# Patient Record
Sex: Male | Born: 1960 | Race: Black or African American | Hispanic: No | Marital: Single | Smoking: Never smoker
Health system: Southern US, Community
[De-identification: ages and names within clinical notes are randomized; demographics above are authoritative.]

## PROBLEM LIST (undated history)

## (undated) DIAGNOSIS — E119 Type 2 diabetes mellitus without complications: Secondary | ICD-10-CM

## (undated) DIAGNOSIS — I10 Essential (primary) hypertension: Secondary | ICD-10-CM

## (undated) HISTORY — PX: CORONARY ANGIOPLASTY WITH STENT PLACEMENT: SHX49

## (undated) HISTORY — PX: APPENDECTOMY: SHX54

---

## 2013-10-28 DIAGNOSIS — Z794 Long term (current) use of insulin: Secondary | ICD-10-CM | POA: Insufficient documentation

## 2013-10-28 DIAGNOSIS — I4892 Unspecified atrial flutter: Secondary | ICD-10-CM | POA: Insufficient documentation

## 2013-10-28 DIAGNOSIS — M2550 Pain in unspecified joint: Secondary | ICD-10-CM | POA: Insufficient documentation

## 2013-10-28 DIAGNOSIS — I4891 Unspecified atrial fibrillation: Secondary | ICD-10-CM | POA: Insufficient documentation

## 2013-12-29 DIAGNOSIS — M1180 Other specified crystal arthropathies, unspecified site: Secondary | ICD-10-CM | POA: Insufficient documentation

## 2014-10-07 DIAGNOSIS — E785 Hyperlipidemia, unspecified: Secondary | ICD-10-CM | POA: Insufficient documentation

## 2014-10-07 DIAGNOSIS — G63 Polyneuropathy in diseases classified elsewhere: Secondary | ICD-10-CM | POA: Insufficient documentation

## 2014-10-07 DIAGNOSIS — E1121 Type 2 diabetes mellitus with diabetic nephropathy: Secondary | ICD-10-CM | POA: Insufficient documentation

## 2016-01-19 ENCOUNTER — Emergency Department (HOSPITAL_COMMUNITY)
Admission: EM | Admit: 2016-01-19 | Discharge: 2016-01-19 | Disposition: A | Discharge status: Home / Self Care | Payer: Self-pay | Attending: Emergency Medicine | Admitting: Emergency Medicine

## 2016-01-19 ENCOUNTER — Encounter (HOSPITAL_COMMUNITY): Payer: Self-pay | Admitting: Emergency Medicine

## 2016-01-19 ENCOUNTER — Emergency Department (HOSPITAL_COMMUNITY): Payer: Self-pay

## 2016-01-19 DIAGNOSIS — R0789 Other chest pain: Secondary | ICD-10-CM | POA: Insufficient documentation

## 2016-01-19 DIAGNOSIS — Z9861 Coronary angioplasty status: Secondary | ICD-10-CM | POA: Insufficient documentation

## 2016-01-19 DIAGNOSIS — I10 Essential (primary) hypertension: Secondary | ICD-10-CM | POA: Insufficient documentation

## 2016-01-19 DIAGNOSIS — E119 Type 2 diabetes mellitus without complications: Secondary | ICD-10-CM | POA: Insufficient documentation

## 2016-01-19 HISTORY — DX: Type 2 diabetes mellitus without complications: E11.9

## 2016-01-19 HISTORY — DX: Essential (primary) hypertension: I10

## 2016-01-19 LAB — CBC
HEMATOCRIT: 34 % — AB (ref 39.0–52.0)
HEMOGLOBIN: 11.4 g/dL — AB (ref 13.0–17.0)
MCH: 31.1 pg (ref 26.0–34.0)
MCHC: 33.5 g/dL (ref 30.0–36.0)
MCV: 92.9 fL (ref 78.0–100.0)
Platelets: 263 10*3/uL (ref 150–400)
RBC: 3.66 MIL/uL — ABNORMAL LOW (ref 4.22–5.81)
RDW: 11.5 % (ref 11.5–15.5)
WBC: 6.8 10*3/uL (ref 4.0–10.5)

## 2016-01-19 LAB — PROTIME-INR
INR: 1.25 (ref 0.00–1.49)
PROTHROMBIN TIME: 15.9 s — AB (ref 11.6–15.2)

## 2016-01-19 LAB — COMPREHENSIVE METABOLIC PANEL
ALBUMIN: 3.8 g/dL (ref 3.5–5.0)
ALT: 17 U/L (ref 17–63)
AST: 23 U/L (ref 15–41)
Alkaline Phosphatase: 60 U/L (ref 38–126)
Anion gap: 9 (ref 5–15)
BUN: 19 mg/dL (ref 6–20)
CHLORIDE: 102 mmol/L (ref 101–111)
CO2: 23 mmol/L (ref 22–32)
CREATININE: 0.85 mg/dL (ref 0.61–1.24)
Calcium: 9.4 mg/dL (ref 8.9–10.3)
GFR calc non Af Amer: >60 mL/min (ref >60)
Glucose, Bld: 347 mg/dL — ABNORMAL HIGH (ref 65–99)
POTASSIUM: 4.5 mmol/L (ref 3.5–5.1)
Sodium: 134 mmol/L — ABNORMAL LOW (ref 135–145)
Total Bilirubin: 0.9 mg/dL (ref 0.3–1.2)
Total Protein: 6.3 g/dL — ABNORMAL LOW (ref 6.5–8.1)

## 2016-01-19 LAB — TROPONIN I

## 2016-01-19 LAB — MAGNESIUM: MAGNESIUM: 1.7 mg/dL (ref 1.7–2.4)

## 2016-01-19 LAB — I-STAT TROPONIN, ED: TROPONIN I, POC: 0 ng/mL (ref 0.00–0.08)

## 2016-01-19 MED ORDER — ASPIRIN 81 MG PO CHEW: 324.0000 mg | CHEWABLE_TABLET | Freq: Once | ORAL | Status: DC

## 2016-01-19 NOTE — ED Provider Notes (Signed)
CSN: [id number]     Arrival date & time 01/19/16  1057 History   First MD Initiated Contact with Patient 01/19/16 1059     Chief Complaint  Patient presents with  . Chest Pain     (Consider location/radiation/quality/duration/timing/severity/associated sxs/prior Treatment) HPI Vision presents with chest pain, resolved. Patient was well prior to the onset of pain about 2 hours ago. Pain was left-sided, sore, moderate, with radiation to left arm. Pain resolved with aspirin, nitroglycerin provided by EMS providers. Currently, no complaints. Patient does acknowledge recent GI like illness, though this seems mild, more with stomach discomfort, then with focal abdominal pain, no fever, no substantial diarrhea, nor vomiting. Multiple sick contacts recently. He also has a notable history stent placement 1 year ago.   Past Medical History  Diagnosis Date  . Hypertension   . Diabetes mellitus without complication [facility](HCC)    Past Surgical History  Procedure Laterality Date  . Appendectomy    . Coronary angioplasty with stent placement     History reviewed. No pertinent family history. Social History  Substance Use Topics  . Smoking status: Never Smoker   . Smokeless tobacco: None  . Alcohol Use: Yes    Review of Systems  Constitutional:       Per HPI, otherwise negative  HENT:       Per HPI, otherwise negative  Respiratory:       Per HPI, otherwise negative  Cardiovascular:       Per HPI, otherwise negative  Gastrointestinal: Negative for vomiting.  Endocrine:       Negative aside from HPI  Genitourinary:       Neg aside from HPI   Musculoskeletal:       Per HPI, otherwise negative  Skin: Negative.   Neurological: Negative for syncope.      Allergies  Review of patient's allergies indicates no known allergies.  Home Medications   Prior to Admission medications   Not on File   BP 132/79 mmHg  Pulse 64  Temp(Src) 98.1 F (36.7 C) (Oral)  Resp 17  SpO2  100% Physical Exam  Constitutional: He is oriented to person, place, and time. He appears well-developed. No distress.  HENT:  Head: Normocephalic and atraumatic.  Eyes: Conjunctivae and EOM are normal.  Cardiovascular: Normal rate and regular rhythm.   Pulmonary/Chest: Effort normal. No stridor. No respiratory distress.  Abdominal: He exhibits no distension. There is no tenderness.  Musculoskeletal: He exhibits no edema.  Neurological: He is alert and oriented to person, place, and time.  Skin: Skin is warm and dry.  Psychiatric: He has a normal mood and affect.  Nursing note and vitals reviewed.   ED Course  Procedures (including critical care time) Labs Review Labs Reviewed  CBC - Abnormal; Notable for the following:    RBC 3.66 (*)    Hemoglobin 11.4 (*)    HCT 34.0 (*)    All other components within normal limits  COMPREHENSIVE METABOLIC PANEL - Abnormal; Notable for the following:    Sodium 134 (*)    Glucose, Bld 347 (*)    Total Protein 6.3 (*)    All other components within normal limits  PROTIME-INR - Abnormal; Notable for the following:    Prothrombin Time 15.9 (*)    All other components within normal limits  MAGNESIUM  TROPONIN I    Imaging Review Dg Chest 2 View  01/19/2016  CLINICAL DATA:  Left-sided chest pain. EXAM: CHEST  2 VIEW COMPARISON:  None. FINDINGS: The heart size and mediastinal contours are within normal limits. Both lungs are clear. No pneumothorax or pleural effusion is noted. The visualized skeletal structures are unremarkable. IMPRESSION: No active cardiopulmonary disease. Electronically Signed   By: Lupita Raider, M.D.   On: 01/19/2016 12:11   I have personally reviewed and evaluated these images and lab results as part of my medical decision-making.   EKG Interpretation   Date/Time:  Thursday January 19 2016 11:01:10 EDT Ventricular Rate:  64 PR Interval:  124 QRS Duration: 78 QT Interval:  413 QTC Calculation: 426 R Axis:    47 Text Interpretation:  Sinus rhythm Sinus rhythm T wave abnormality  Artifact Abnormal ekg Confirmed by Gerhard Munch  MD ([id number]) on  01/19/2016 11:11:11 AM     On repeat exam the patient is in no distress No additional pain. Patient is awaiting troponin #2.   4:16 PM Patient in no distress.  He has no pain.  I discussed all findings with him.  He will f/u w PMD. MDM  Patient presents after episodic chest pain. Here, the patient awake and alert, in no distress, with no ongoing pain. Patient is taking aspirin for prevention. Here, the patient's evaluation is largely reassuring, with no recurrence of pain, no vital sign abnormalities, serial negative troponins, unremarkable EKG. With no evidence for ongoing ischemia, PE, other acute new findings, patient will follow up with his primary care physician.  Gerhard Munch, MD 01/19/16 1623

## 2016-01-19 NOTE — ED Notes (Signed)
Pt here from home with c/o left side chest pain that went down his left arm , pt had 324 asa and 1 nitro from ems , pt arrived with no c/o pain

## 2016-01-19 NOTE — Discharge Instructions (Signed)
As discussed, your evaluation today has been largely reassuring.  But, it is important that you monitor your condition carefully, and do not hesitate to return to the ED if you develop new, or concerning changes in your condition. ? ?Otherwise, please follow-up with your physician for appropriate ongoing care. ? ?

## 2016-04-26 DIAGNOSIS — M21611 Bunion of right foot: Secondary | ICD-10-CM | POA: Insufficient documentation

## 2017-04-08 DIAGNOSIS — G8929 Other chronic pain: Secondary | ICD-10-CM | POA: Insufficient documentation

## 2017-04-08 DIAGNOSIS — R45850 Homicidal ideations: Secondary | ICD-10-CM | POA: Insufficient documentation

## 2017-04-11 DIAGNOSIS — Z0000 Encounter for general adult medical examination without abnormal findings: Secondary | ICD-10-CM | POA: Insufficient documentation

## 2017-05-12 ENCOUNTER — Encounter (HOSPITAL_COMMUNITY): Payer: Self-pay | Admitting: Emergency Medicine

## 2017-05-12 ENCOUNTER — Emergency Department (HOSPITAL_COMMUNITY): Payer: Self-pay

## 2017-05-12 ENCOUNTER — Observation Stay (HOSPITAL_COMMUNITY)
Admission: EM | Admit: 2017-05-12 | Discharge: 2017-05-13 | Disposition: A | Discharge status: Home / Self Care | Payer: Self-pay | Attending: Family Medicine | Admitting: Family Medicine

## 2017-05-12 DIAGNOSIS — Z79899 Other long term (current) drug therapy: Secondary | ICD-10-CM | POA: Insufficient documentation

## 2017-05-12 DIAGNOSIS — D649 Anemia, unspecified: Secondary | ICD-10-CM | POA: Insufficient documentation

## 2017-05-12 DIAGNOSIS — R0789 Other chest pain: Secondary | ICD-10-CM | POA: Diagnosis present

## 2017-05-12 DIAGNOSIS — I2510 Atherosclerotic heart disease of native coronary artery without angina pectoris: Secondary | ICD-10-CM | POA: Insufficient documentation

## 2017-05-12 DIAGNOSIS — Z7982 Long term (current) use of aspirin: Secondary | ICD-10-CM | POA: Insufficient documentation

## 2017-05-12 DIAGNOSIS — R079 Chest pain, unspecified: Principal | ICD-10-CM | POA: Diagnosis present

## 2017-05-12 DIAGNOSIS — E119 Type 2 diabetes mellitus without complications: Secondary | ICD-10-CM | POA: Insufficient documentation

## 2017-05-12 DIAGNOSIS — Z8679 Personal history of other diseases of the circulatory system: Secondary | ICD-10-CM

## 2017-05-12 DIAGNOSIS — Z955 Presence of coronary angioplasty implant and graft: Secondary | ICD-10-CM | POA: Insufficient documentation

## 2017-05-12 DIAGNOSIS — Z794 Long term (current) use of insulin: Secondary | ICD-10-CM | POA: Insufficient documentation

## 2017-05-12 DIAGNOSIS — Z9049 Acquired absence of other specified parts of digestive tract: Secondary | ICD-10-CM | POA: Insufficient documentation

## 2017-05-12 DIAGNOSIS — I10 Essential (primary) hypertension: Secondary | ICD-10-CM | POA: Diagnosis present

## 2017-05-12 DIAGNOSIS — IMO0001 Reserved for inherently not codable concepts without codable children: Secondary | ICD-10-CM

## 2017-05-12 LAB — CBC
HEMATOCRIT: 27.2 % — AB (ref 39.0–52.0)
Hemoglobin: 9.6 g/dL — ABNORMAL LOW (ref 13.0–17.0)
MCH: 32.8 pg (ref 26.0–34.0)
MCHC: 35.3 g/dL (ref 30.0–36.0)
MCV: 92.8 fL (ref 78.0–100.0)
PLATELETS: 261 10*3/uL (ref 150–400)
RBC: 2.93 MIL/uL — AB (ref 4.22–5.81)
RDW: 11.9 % (ref 11.5–15.5)
WBC: 8.2 10*3/uL (ref 4.0–10.5)

## 2017-05-12 LAB — BASIC METABOLIC PANEL
ANION GAP: 7 (ref 5–15)
BUN: 16 mg/dL (ref 6–20)
CALCIUM: 8.5 mg/dL — AB (ref 8.9–10.3)
CO2: 24 mmol/L (ref 22–32)
Chloride: 108 mmol/L (ref 101–111)
Creatinine, Ser: 0.85 mg/dL (ref 0.61–1.24)
GFR calc non Af Amer: >60 mL/min (ref >60)
GLUCOSE: 274 mg/dL — AB (ref 65–99)
Potassium: 3.7 mmol/L (ref 3.5–5.1)
Sodium: 139 mmol/L (ref 135–145)

## 2017-05-12 LAB — I-STAT TROPONIN, ED: Troponin i, poc: 0.01 ng/mL (ref 0.00–0.08)

## 2017-05-12 LAB — CBG MONITORING, ED: Glucose-Capillary: 268 mg/dL — ABNORMAL HIGH (ref 65–99)

## 2017-05-12 LAB — GLUCOSE, CAPILLARY: GLUCOSE-CAPILLARY: 198 mg/dL — AB (ref 65–99)

## 2017-05-12 LAB — TROPONIN I

## 2017-05-12 MED ORDER — INSULIN ASPART 100 UNIT/ML ~~LOC~~ SOLN: 3.0000 [IU] | Freq: Three times a day (TID) | SUBCUTANEOUS | Status: DC

## 2017-05-12 MED ORDER — ASPIRIN EC 81 MG PO TBEC
81.0000 mg | DELAYED_RELEASE_TABLET | Freq: Every day | ORAL | Status: DC
  Administered 2017-05-13: 81 mg via ORAL
  Filled 2017-05-12: qty 1

## 2017-05-12 MED ORDER — NITROGLYCERIN 0.4 MG SL SUBL: 0.4000 mg | SUBLINGUAL_TABLET | SUBLINGUAL | Status: DC | PRN

## 2017-05-12 MED ORDER — MORPHINE SULFATE (PF) 2 MG/ML IV SOLN
1.0000 mg | INTRAVENOUS | Status: DC | PRN
  Administered 2017-05-13: 2 mg via INTRAVENOUS
  Filled 2017-05-12 (×2): qty 1

## 2017-05-12 MED ORDER — NITROGLYCERIN 0.4 MG SL SUBL
0.4000 mg | SUBLINGUAL_TABLET | SUBLINGUAL | Status: DC | PRN
  Administered 2017-05-12 (×2): 0.4 mg via SUBLINGUAL
  Filled 2017-05-12: qty 1

## 2017-05-12 MED ORDER — ENOXAPARIN SODIUM 40 MG/0.4ML ~~LOC~~ SOLN
40.0000 mg | SUBCUTANEOUS | Status: DC
  Administered 2017-05-13: 40 mg via SUBCUTANEOUS
  Filled 2017-05-12: qty 0.4

## 2017-05-12 MED ORDER — ONDANSETRON HCL 4 MG/2ML IJ SOLN: 4.0000 mg | Freq: Four times a day (QID) | INTRAMUSCULAR | Status: DC | PRN

## 2017-05-12 MED ORDER — ACETAMINOPHEN 325 MG PO TABS
650.0000 mg | ORAL_TABLET | Freq: Once | ORAL | Status: AC
  Administered 2017-05-12: 650 mg via ORAL
  Filled 2017-05-12: qty 2

## 2017-05-12 MED ORDER — INSULIN ASPART 100 UNIT/ML ~~LOC~~ SOLN: 0.0000 [IU] | Freq: Every day | SUBCUTANEOUS | Status: DC

## 2017-05-12 MED ORDER — ACETAMINOPHEN 325 MG PO TABS
650.0000 mg | ORAL_TABLET | ORAL | Status: DC | PRN
  Administered 2017-05-12 – 2017-05-13 (×2): 650 mg via ORAL
  Filled 2017-05-12: qty 2

## 2017-05-12 MED ORDER — AMITRIPTYLINE HCL 50 MG PO TABS
50.0000 mg | ORAL_TABLET | Freq: Every evening | ORAL | Status: DC | PRN
  Administered 2017-05-12: 50 mg via ORAL
  Filled 2017-05-12 (×2): qty 1

## 2017-05-12 MED ORDER — ASPIRIN 81 MG PO CHEW
324.0000 mg | CHEWABLE_TABLET | Freq: Once | ORAL | Status: AC
  Administered 2017-05-12: 324 mg via ORAL
  Filled 2017-05-12: qty 4

## 2017-05-12 MED ORDER — INSULIN GLARGINE 100 UNIT/ML ~~LOC~~ SOLN
20.0000 [IU] | Freq: Every day | SUBCUTANEOUS | Status: DC
  Administered 2017-05-12: 20 [IU] via SUBCUTANEOUS
  Filled 2017-05-12 (×2): qty 0.2

## 2017-05-12 MED ORDER — INSULIN ASPART 100 UNIT/ML ~~LOC~~ SOLN: 0.0000 [IU] | Freq: Three times a day (TID) | SUBCUTANEOUS | Status: DC

## 2017-05-12 NOTE — ED Notes (Signed)
carelink called  

## 2017-05-12 NOTE — H&P (Signed)
History and Physical    Todd Wagner OFB:[id number] DOB: January 04, 1961 DOA: 05/12/2017  PCP: [facility]   Patient coming from: Home  Chief Complaint: Chest pain, elevated CBG's  HPI: Todd Wagner is a 56 y.o. male with medical history significant for insulin-dependent diabetes mellitus, hypertension, atrial fibrillation status post ablation, and coronary artery disease with stent placed a couple years ago, now presenting to the emergency department with 3 or 4 hours of chest pain. Patient reports that his glucose readings have been elevated at home for the past day and he has been feeling generally weak, but was otherwise well until approximately 4 hours prior to his arrival when he experienced a moderate, dull, pain in the left chest and shoulder. Symptoms waxed and waned some, but with no definite alleviating or exacerbating factor identified. He called EMS was brought into the ED for evaluation. He denies recent fevers or chills and denies dyspnea or cough. There has not been any nausea or diaphoresis. He reports occasional episodes of similar pain since his stent was placed couple years ago.  ED Course: Upon arrival to the ED, patient is found to be afebrile, saturating well on room air, and with vitals otherwise stable. EKG features a sinus rhythm. Chest x-ray is negative for acute cardiopulmonary disease. Chemistry panel reveals a glucose of 274. CBC is notable for a normocytic anemia with hemoglobin 9.6, down from 12.9 in February. Troponin is within normal limits. Patient was treated with 324 mg of aspirin and sublingual nitroglycerin in the ED. He reported some improvement in his pain with nitroglycerin. He remained hemodynamically stable in the ED and in no apparent respiratory distress. He will be observed on the stepdown unit at [facility] for ongoing evaluation and management of chest pain in a patient with known CAD.  Review of  Systems:  All other systems reviewed and apart from HPI, are negative.  Past Medical History:  Diagnosis Date  . Diabetes mellitus without complication (HCC)   . Hypertension     Past Surgical History:  Procedure Laterality Date  . APPENDECTOMY    . CORONARY ANGIOPLASTY WITH STENT PLACEMENT       reports that he has never smoked. He does not have any smokeless tobacco history on file. He reports that he drinks alcohol. He reports that he does not use drugs.  No Known Allergies  History reviewed. No pertinent family history.   Prior to Admission medications   Medication Sig Start Date End Date Taking? Authorizing Provider  aspirin 81 MG tablet Take 81 mg by mouth daily.    [provider]  insulin NPH-regular Human (NOVOLIN 70/30) (70-30) 100 UNIT/ML injection Inject 20-25 Units into the skin See admin instructions. Pt takes 25 units in am and 20 units at bedtime    [provider]    Physical Exam: Vitals:   05/12/17 1541 05/12/17 1636 05/12/17 1644  BP: 135/75 120/66 116/69  Pulse: 66 70 60  Resp: 16 15 14   Temp: 97.7 F (36.5 C)    TempSrc: Oral    SpO2: 100% 99% 100%  Weight:  74.8 kg (165 lb)   Height:  6' (1.829 m)       Constitutional: NAD, calm, appears uncomfortable Eyes: PERTLA, lids and conjunctivae normal ENMT: Mucous membranes are moist. Posterior pharynx clear of any exudate or lesions.   Neck: normal, supple, no masses, no thyromegaly Respiratory: clear to auscultation bilaterally, no wheezing, no crackles. Normal respiratory effort.  Cardiovascular: S1 & S2 heard, regular rate and rhythm. No extremity edema. No significant JVD. Abdomen: No distension, no tenderness, no masses palpated. Bowel sounds normal.  Musculoskeletal: no clubbing / cyanosis. No joint deformity upper and lower extremities.    Skin: no significant rashes, lesions, ulcers. Warm, dry, well-perfused. Neurologic: CN 2-12 grossly intact. Sensation intact, DTR  normal. Strength 5/5 in all 4 limbs.  Psychiatric: Alert and oriented x 3. Calm, cooperative.     Labs on Admission: I have personally reviewed following labs and imaging studies  CBC:  Recent Labs Lab 05/12/17 1625  WBC 8.2  HGB 9.6*  HCT 27.2*  MCV 92.8  PLT 261   Basic Metabolic Panel:  Recent Labs Lab 05/12/17 1625  NA 139  K 3.7  CL 108  CO2 24  GLUCOSE 274*  BUN 16  CREATININE 0.85  CALCIUM 8.5*   GFR: Estimated Creatinine Clearance: 105.1 mL/min (by C-G formula based on SCr of 0.85 mg/dL). Liver Function Tests: No results for input(s): AST, ALT, ALKPHOS, BILITOT, PROT, ALBUMIN in the last 168 hours. No results for input(s): LIPASE, AMYLASE in the last 168 hours. No results for input(s): AMMONIA in the last 168 hours. Coagulation Profile: No results for input(s): INR, PROTIME in the last 168 hours. Cardiac Enzymes: No results for input(s): CKTOTAL, CKMB, CKMBINDEX, TROPONINI in the last 168 hours. BNP (last 3 results) No results for input(s): PROBNP in the last 8760 hours. HbA1C: No results for input(s): HGBA1C in the last 72 hours. CBG:  Recent Labs Lab 05/12/17 1628  GLUCAP 268*   Lipid Profile: No results for input(s): CHOL, HDL, LDLCALC, TRIG, CHOLHDL, LDLDIRECT in the last 72 hours. Thyroid Function Tests: No results for input(s): TSH, T4TOTAL, FREET4, T3FREE, THYROIDAB in the last 72 hours. Anemia Panel: No results for input(s): VITAMINB12, FOLATE, FERRITIN, TIBC, IRON, RETICCTPCT in the last 72 hours. Urine analysis: No results found for: COLORURINE, APPEARANCEUR, LABSPEC, PHURINE, GLUCOSEU, HGBUR, BILIRUBINUR, KETONESUR, PROTEINUR, UROBILINOGEN, NITRITE, LEUKOCYTESUR Sepsis Labs: @LABRCNTIP (procalcitonin:4,lacticidven:4) )No results found for this or any previous visit (from the past 240 hour(s)).   Radiological Exams on Admission: Dg Chest Portable 1 View  Result Date: 05/12/2017 CLINICAL DATA:  Hyperglycemia.  Hypertension EXAM:  PORTABLE CHEST 1 VIEW COMPARISON:  January 19, 2016 FINDINGS: There is no edema or consolidation. Heart is upper normal in size with pulmonary vascularity within normal limits. No adenopathy. No bone lesions. IMPRESSION: No edema or consolidation. Electronically Signed   By: Bretta Bang III M.D.   On: 05/12/2017 16:19    EKG: Independently reviewed. Normal sinus rhythm.   Assessment/Plan   1. Chest pain - Pt presents with 3-4 hours of pain in left chest and shoulder  - He has known CAD with stent from [facility], but also known left rotator cuff tendonopathy  - EKG without acute ischemic features and troponin wnl  - Treated in ED with ASA 324 and SL NTG  - Plan to continue cardiac monitoring, obtain serial troponin measurements, repeat EKG    2. Insulin-dependent DM  - A1c was 8.1% in July 2018 - Managed at home with insulin NPH 25 units qAM and 20 units qPM  - Check CBG's with meals and qHS - Start Lantus 20 units qHS with Novolog 3 units TID with meals, and per moderate-intensity sliding-scale   3. Anemia  - Hgb is 9.6 on admission, down from 12.9 in February 2018  - Pt denies melena or hematochezia  - Check anemia panel   4. Hx  of atrial fibrillation  - Status-post ablation  - In NSR on admission  - CHADS-VASc is 3 (HTN, DM, CAD)  - No a fib since ablation per report    DVT prophylaxis: sq Lovenox Code Status: Full  Family Communication: Discussed with patient Disposition Plan: Observe in SDU at [facility] Consults called: None Admission status: Observation    Briscoe Deutscher, MD [facility] Pager [phone]  If 7PM-7AM, please contact night-coverage [email or web address] Password [facility]  05/12/2017, 6:19 PM

## 2017-05-12 NOTE — ED Provider Notes (Signed)
[facility]-EMERGENCY DEPT Provider Note   CSN: [id number] Arrival date & time: 05/12/17  1526     History   Chief Complaint Chief Complaint  Patient presents with  . Chest Pain    HPI Todd Wagner is a 56 y.o. male.  HPI  Patient presents to the emergency room with complaints of chest pain. According to the EMS notes the patient was complaining of hyperglycemia since last night. However, when I asked the patient what brought him to the emergency room he said it was carefully started having chest pain a few hours ago. He is having constant discomfort, pressure in the left side of his chest. He also has some taken his shoulder. He denies any nausea or shortness of breath. No vomiting or diarrhea. No fevers or chills. No coughing. No recent injuries. Patient does have history of coronary artery disease with stent. He has history of diabetes denies any tobacco use but he does smoke marijuana Past Medical History:  Diagnosis Date  . Diabetes mellitus without complication (HCC)   . Hypertension     There are no active problems to display for this patient.   Past Surgical History:  Procedure Laterality Date  . APPENDECTOMY    . CORONARY ANGIOPLASTY WITH STENT PLACEMENT         Home Medications    Prior to Admission medications   Medication Sig Start Date End Date Taking? Authorizing Provider  aspirin 81 MG tablet Take 81 mg by mouth daily.    [provider]  insulin NPH-regular Human (NOVOLIN 70/30) (70-30) 100 UNIT/ML injection Inject 20-25 Units into the skin See admin instructions. Pt takes 25 units in am and 20 units at bedtime    [provider]    Family History No family history on file.  Social History Social History  Substance Use Topics  . Smoking status: Never Smoker  . Smokeless tobacco: Not on file  . Alcohol use Yes     Allergies   Patient has no known allergies.   Review of Systems Review of Systems  All other systems reviewed and  are negative.    Physical Exam Updated Vital Signs BP 116/69 (BP Location: Right Arm)   Pulse 60   Temp 97.7 F (36.5 C) (Oral)   Resp 14   Ht 1.829 m (6')   Wt 74.8 kg (165 lb)   SpO2 100%   BMI 22.38 kg/m   Physical Exam  Constitutional: He appears well-developed and well-nourished. No distress.  HENT:  Head: Normocephalic and atraumatic.  Right Ear: External ear normal.  Left Ear: External ear normal.  Eyes: Conjunctivae are normal. Right eye exhibits no discharge. Left eye exhibits no discharge. No scleral icterus.  Neck: Neck supple. No tracheal deviation present.  Cardiovascular: Normal rate, regular rhythm and intact distal pulses.   Pulmonary/Chest: Effort normal and breath sounds normal. No stridor. No respiratory distress. He has no wheezes. He has no rales.  Abdominal: Soft. Bowel sounds are normal. He exhibits no distension. There is no tenderness. There is no rebound and no guarding.  Musculoskeletal: He exhibits no edema or tenderness.  Neurological: He is alert. He has normal strength. No cranial nerve deficit (no facial droop, extraocular movements intact, no slurred speech) or sensory deficit. He exhibits normal muscle tone. He displays no seizure activity. Coordination normal.  Skin: Skin is warm and dry. No rash noted.  Psychiatric: He has a normal mood and affect.  Nursing note and vitals reviewed.  ED Treatments / Results  Labs (all labs ordered are listed, but only abnormal results are displayed) Labs Reviewed  BASIC METABOLIC PANEL - Abnormal; Notable for the following:       Result Value   Glucose, Bld 274 (*)    Calcium 8.5 (*)    All other components within normal limits  CBC - Abnormal; Notable for the following:    RBC 2.93 (*)    Hemoglobin 9.6 (*)    HCT 27.2 (*)    All other components within normal limits  CBG MONITORING, ED - Abnormal; Notable for the following:    Glucose-Capillary 268 (*)    All other components within normal  limits  I-STAT TROPONIN, ED    EKG  EKG Interpretation  Date/Time:  Sunday May 12 2017 15:46:24 EDT Ventricular Rate:  65 PR Interval:    QRS Duration: 73 QT Interval:  421 QTC Calculation: 438 R Axis:   67 Text Interpretation:  Sinus rhythm No significant change since last tracing Confirmed by Linwood Dibbles [phone]) on 05/12/2017 4:23:51 PM       Radiology Dg Chest Portable 1 View  Result Date: 05/12/2017 CLINICAL DATA:  Hyperglycemia.  Hypertension EXAM: PORTABLE CHEST 1 VIEW COMPARISON:  January 19, 2016 FINDINGS: There is no edema or consolidation. Heart is upper normal in size with pulmonary vascularity within normal limits. No adenopathy. No bone lesions. IMPRESSION: No edema or consolidation. Electronically Signed   By: Bretta Bang III M.D.   On: 05/12/2017 16:19    Procedures Procedures (including critical care time)  Medications Ordered in ED Medications  nitroGLYCERIN (NITROSTAT) SL tablet 0.4 mg (0.4 mg Sublingual Refused 05/12/17 1650)  aspirin chewable tablet 324 mg (324 mg Oral Given 05/12/17 1622)  acetaminophen (TYLENOL) tablet 650 mg (650 mg Oral Given 05/12/17 1706)     Initial Impression / Assessment and Plan / ED Course  I have reviewed the triage vital signs and the nursing notes.  Pertinent labs & imaging results that were available during my care of the patient were reviewed by me and considered in my medical decision making (see chart for details).  Clinical Course as of May 13 1803  Wynelle Link May 12, 2017  1756 Reviewed cardiology notes in EPIC.  Pt states he has a stent however cardiology notes in the EMR don't mention a stent.  He has history of a fib s/p ablation  [JK]  1758 Pt presents with chest pain.  ?hx of cad.  Pt may not be clear about his history.  He has risk factors of HTN, DM, family history of heart problems.   moderate risk heart score.    [JK]    Clinical Course User Index [JK] Linwood Dibbles, MD   Patient presents to emergency room with  chest pain. Patient has history of diabetes, hypertension. He presents with symptoms concerning for acute coronary syndrome. Patient had substernal chest pain. It has improved with nitroglycerin. Initial troponin is normal. He  has a moderate risk heart score. I think he warrants overnight observation for serial cardiac enzymes and possible further stress testing.  Final Clinical Impressions(s) / ED Diagnoses   Final diagnoses:  Chest pain, unspecified type      Linwood Dibbles, MD 05/12/17 1191

## 2017-05-12 NOTE — ED Notes (Signed)
ED TO INPATIENT HANDOFF REPORT  Name/Age/Gender Todd Wagner 56 y.o. male  Code Status    Code Status Orders        Start     Ordered   05/12/17 1817  Full code  Continuous     05/12/17 1819    Code Status History    Date Active Date Inactive Code Status Order ID Comments User Context   This patient has a current code status but no historical code status.      Home/SNF/Other Home  Chief Complaint Hyperglycemia  Level of Care/Admitting Diagnosis ED Disposition    ED Disposition Condition [facility] Area: [place] [100100]  Level of Care: Stepdown [14]  Diagnosis: Chest pain [157262]  Admitting Physician: Vianne Bulls [[id number]]  Attending Physician: Vianne Bulls [[id number]]  PT Class (Do Not Modify): Observation [104]  PT Acc Code (Do Not Modify): Observation [10022]       Medical History Past Medical History:  Diagnosis Date  . Diabetes mellitus without complication ([place])   . Hypertension     Allergies No Known Allergies  IV Location/Drains/Wounds Patient Lines/Drains/Airways Status   Active Line/Drains/Airways    Name:   Placement date:   Placement time:   Site:   Days:   Peripheral IV 05/12/17 Right Forearm  05/12/17    1628    Forearm    less than 1          Labs/Imaging Results for orders placed or performed during the hospital encounter of 05/12/17 (from the past 48 hour(s))  Basic metabolic panel     Status: Abnormal   Collection Time: 05/12/17  4:25 PM  Result Value Ref Range   Sodium 139 135 - 145 mmol/L   Potassium 3.7 3.5 - 5.1 mmol/L   Chloride 108 101 - 111 mmol/L   CO2 24 22 - 32 mmol/L   Glucose, Bld 274 (H) 65 - 99 mg/dL   BUN 16 6 - 20 mg/dL   Creatinine, Ser 0.85 0.61 - 1.24 mg/dL   Calcium 8.5 (L) 8.9 - 10.3 mg/dL   GFR calc non Af Amer >60 >60 mL/min   GFR calc Af Amer >60 >60 mL/min    Comment: (NOTE) The eGFR has been calculated using the CKD EPI equation. This calculation has  not been validated in all clinical situations. eGFR's persistently <60 mL/min signify possible Chronic Kidney Disease.    Anion gap 7 5 - 15  CBC     Status: Abnormal   Collection Time: 05/12/17  4:25 PM  Result Value Ref Range   WBC 8.2 4.0 - 10.5 K/uL   RBC 2.93 (L) 4.22 - 5.81 MIL/uL   Hemoglobin 9.6 (L) 13.0 - 17.0 g/dL   HCT 27.2 (L) 39.0 - 52.0 %   MCV 92.8 78.0 - 100.0 fL   MCH 32.8 26.0 - 34.0 pg   MCHC 35.3 30.0 - 36.0 g/dL   RDW 11.9 11.5 - 15.5 %   Platelets 261 150 - 400 K/uL  CBG monitoring, ED     Status: Abnormal   Collection Time: 05/12/17  4:28 PM  Result Value Ref Range   Glucose-Capillary 268 (H) 65 - 99 mg/dL  I-stat troponin, ED (0, 3)     Status: None   Collection Time: 05/12/17  4:34 PM  Result Value Ref Range   Troponin i, poc 0.01 0.00 - 0.08 ng/mL   Comment 3  Comment: Due to the release kinetics of cTnI, a negative result within the first hours of the onset of symptoms does not rule out myocardial infarction with certainty. If myocardial infarction is still suspected, repeat the test at appropriate intervals.    Dg Chest Portable 1 View  Result Date: 05/12/2017 CLINICAL DATA:  Hyperglycemia.  Hypertension EXAM: PORTABLE CHEST 1 VIEW COMPARISON:  January 19, 2016 FINDINGS: There is no edema or consolidation. Heart is upper normal in size with pulmonary vascularity within normal limits. No adenopathy. No bone lesions. IMPRESSION: No edema or consolidation. Electronically Signed   By: Lowella Grip III M.D.   On: 05/12/2017 16:19    Pending Labs Unresulted Labs    Start     Ordered   05/19/17 0500  Creatinine, serum  (enoxaparin (LOVENOX)    CrCl >/= 30 ml/min)  Weekly,   R    Comments:  while on enoxaparin therapy    05/12/17 1819   05/13/17 0500  HIV antibody (Routine Testing)  Tomorrow morning,   R     05/12/17 1819   05/13/17 1638  Basic metabolic panel  Tomorrow morning,   R     05/12/17 1819   05/13/17 0500  CBC  Tomorrow  morning,   R     05/12/17 1819   05/12/17 1819  Ferritin  (Anemia Panel (PNL))  Add-on,   R     05/12/17 1819   05/12/17 1819  Reticulocytes  (Anemia Panel (PNL))  Add-on,   R     05/12/17 1819   05/12/17 1818  Vitamin B12  (Anemia Panel (PNL))  Add-on,   R     05/12/17 1819   05/12/17 1818  Folate  (Anemia Panel (PNL))  Add-on,   R     05/12/17 1819   05/12/17 1818  Iron and TIBC  (Anemia Panel (PNL))  Add-on,   R     05/12/17 1819   05/12/17 1817  Troponin I  Now then every 6 hours,   STAT     05/12/17 1819      Vitals/Pain Today's Vitals   05/12/17 1626 05/12/17 1636 05/12/17 1643 05/12/17 1644  BP:  120/66  116/69  Pulse:  70  60  Resp:  15  14  Temp:      TempSrc:      SpO2:  99%  100%  Weight:  165 lb (74.8 kg)    Height:  6' (1.829 m)    PainSc: 8   7      Isolation Precautions No active isolations  Medications Medications  aspirin tablet 81 mg (not administered)  nitroGLYCERIN (NITROSTAT) SL tablet 0.4 mg (not administered)  acetaminophen (TYLENOL) tablet 650 mg (not administered)  ondansetron (ZOFRAN) injection 4 mg (not administered)  enoxaparin (LOVENOX) injection 40 mg (not administered)  insulin glargine (LANTUS) injection 20 Units (not administered)  insulin aspart (novoLOG) injection 0-15 Units (not administered)  insulin aspart (novoLOG) injection 0-5 Units (not administered)  insulin aspart (novoLOG) injection 3 Units (not administered)  morphine 2 MG/ML injection 1-3 mg (not administered)  aspirin chewable tablet 324 mg (324 mg Oral Given 05/12/17 1622)  acetaminophen (TYLENOL) tablet 650 mg (650 mg Oral Given 05/12/17 1706)    Mobility walks

## 2017-05-12 NOTE — ED Notes (Signed)
Bed: WA03 Expected date:  Expected time:  Means of arrival:  Comments: 56 yo Hyperglycemia- 358

## 2017-05-12 NOTE — ED Triage Notes (Addendum)
Per EMS, patient from home, c/o hyperglycemia since last night. Reports increased weakness since this morning. CBG 368 with EMS. Ambulatory.   BP 120/80 HR 66 RR 16 O2 99%  18g to R AC with EMS

## 2017-05-13 ENCOUNTER — Observation Stay (HOSPITAL_COMMUNITY): Payer: Self-pay

## 2017-05-13 ENCOUNTER — Encounter (HOSPITAL_COMMUNITY): Payer: Self-pay | Admitting: *Deleted

## 2017-05-13 DIAGNOSIS — R079 Chest pain, unspecified: Secondary | ICD-10-CM

## 2017-05-13 LAB — GLUCOSE, CAPILLARY
GLUCOSE-CAPILLARY: 257 mg/dL — AB (ref 65–99)
Glucose-Capillary: 154 mg/dL — ABNORMAL HIGH (ref 65–99)
Glucose-Capillary: 201 mg/dL — ABNORMAL HIGH (ref 65–99)

## 2017-05-13 LAB — MRSA PCR SCREENING: MRSA BY PCR: NEGATIVE

## 2017-05-13 MED ORDER — NITROGLYCERIN 0.4 MG SL SUBL: 0.4000 mg | SUBLINGUAL_TABLET | SUBLINGUAL | 1 refills | Status: AC | PRN

## 2017-05-13 MED ORDER — TECHNETIUM TC 99M TETROFOSMIN IV KIT
10.0000 | PACK | Freq: Once | INTRAVENOUS | Status: AC | PRN
  Administered 2017-05-13: 10 via INTRAVENOUS

## 2017-05-13 MED ORDER — REGADENOSON 0.4 MG/5ML IV SOLN
INTRAVENOUS | Status: AC
  Administered 2017-05-13: 0.4 mg via INTRAVENOUS
  Filled 2017-05-13: qty 5

## 2017-05-13 MED ORDER — TECHNETIUM TC 99M TETROFOSMIN IV KIT
30.0000 | PACK | Freq: Once | INTRAVENOUS | Status: AC | PRN
  Administered 2017-05-13: 30 via INTRAVENOUS

## 2017-05-13 MED ORDER — REGADENOSON 0.4 MG/5ML IV SOLN
0.4000 mg | Freq: Once | INTRAVENOUS | Status: AC
  Administered 2017-05-13: 0.4 mg via INTRAVENOUS
  Filled 2017-05-13: qty 5

## 2017-05-13 MED ORDER — LISINOPRIL 5 MG PO TABS
5.0000 mg | ORAL_TABLET | Freq: Every day | ORAL | Status: DC
  Administered 2017-05-13: 5 mg via ORAL
  Filled 2017-05-13: qty 1

## 2017-05-13 MED ORDER — ATORVASTATIN CALCIUM 40 MG PO TABS
40.0000 mg | ORAL_TABLET | Freq: Every day | ORAL | Status: DC
  Administered 2017-05-13: 40 mg via ORAL
  Filled 2017-05-13: qty 1

## 2017-05-13 MED ORDER — METOPROLOL TARTRATE 25 MG PO TABS
25.0000 mg | ORAL_TABLET | Freq: Two times a day (BID) | ORAL | Status: DC
  Administered 2017-05-13: 25 mg via ORAL
  Filled 2017-05-13: qty 1

## 2017-05-13 MED ORDER — AMITRIPTYLINE HCL 50 MG PO TABS
50.0000 mg | ORAL_TABLET | Freq: Every day | ORAL | Status: DC
  Filled 2017-05-13: qty 3

## 2017-05-13 MED ORDER — PREGABALIN 75 MG PO CAPS: 150.0000 mg | ORAL_CAPSULE | Freq: Two times a day (BID) | ORAL | Status: DC

## 2017-05-13 NOTE — Discharge Summary (Signed)
Physician Discharge Summary  Todd Wagner [id number] DOB: March 28, 1961 DOA: 05/12/2017  PCP: [facility]  Admit date: 05/12/2017 Discharge date: 05/13/2017  Time spent: 35 minutes  Recommendations for Outpatient Follow-up:  ?needs EP studies if continues to have brady--would suggest slight ? toprol per Cardiology Bmet, cbc 1 week Rx nitro this admit-no other med change  Discharge Diagnoses:  Principal Problem:   Chest pain Active Problems:   Essential hypertension   Insulin dependent diabetes mellitus (HCC)   Normocytic anemia   History of atrial fibrillation without current medication   Discharge Condition: fair  Diet recommendation: hh low salt  Filed Weights   05/12/17 1636 05/13/17 0535  Weight: 74.8 kg (165 lb) 75 kg (165 lb 5.5 oz)    History of present illness:   56 ? Htn, dm ii since 89, flutter s/p ablation [facility], nl Stress in the past Admit CP in setting of shoulder issues and work  Cardiology saw patient-rec Myoview, neg workup this admit Given OP OT Rx  Will follow Dr. Rosemary Holms Cardiology here [facility] CV  Procedures: myoview 1. Limited evaluation of the inferior and inferolateral walls due to prominent subdiaphragmatic activity. Ischemia in the distal inferolateral wall difficult to exclude. 2. Normal left ventricular wall motion 3. Left ventricular ejection fraction 61% 4. Non invasive risk stratification*: Low  Consultations:  cardiology  Discharge Exam: Vitals:   05/13/17 1507 05/13/17 1557  BP: 117/76 121/82  Pulse:    Resp:    Temp: 97.6 F (36.4 C)   SpO2:      General: alert pleasant  Cardiovascular: s1 s 2no m/r/g Respiratory: clear Decreased Rom to L shoudler, bicipital groove tenderness  Discharge Instructions   Discharge Instructions    Diet - low sodium heart healthy    Complete by:  As directed    Discharge instructions    Complete by:  As directed    Follow with  Dr. Rosemary Holms of cardiology--if you continue to be dizzy your medications might need adjusting as an Outpatient I have given you a week off of work to get therapy on your L shoulder   Increase activity slowly    Complete by:  As directed      Current Discharge Medication List    START taking these medications   Details  nitroGLYCERIN (NITROSTAT) 0.4 MG SL tablet Place 1 tablet (0.4 mg total) under the tongue every 5 (five) minutes x 3 doses as needed for chest pain. Qty: 30 tablet, Refills: 1      CONTINUE these medications which have NOT CHANGED   Details  amitriptyline (ELAVIL) 50 MG tablet Take 50-150 mg by mouth at bedtime.    aspirin 81 MG tablet Take 81 mg by mouth daily.    atorvastatin (LIPITOR) 40 MG tablet Take 40 mg by mouth daily.    insulin NPH-regular Human (NOVOLIN 70/30) (70-30) 100 UNIT/ML injection Inject 20-25 Units into the skin See admin instructions. Pt takes 25 units in am and 20 units at bedtime    lisinopril (PRINIVIL,ZESTRIL) 5 MG tablet Take 5 mg by mouth daily.    metFORMIN (GLUCOPHAGE) 500 MG tablet Take 500 mg by mouth 2 (two) times daily with a meal.    metoprolol tartrate (LOPRESSOR) 25 MG tablet Take 25 mg by mouth 2 (two) times daily.    pregabalin (LYRICA) 150 MG capsule Take 150 mg by mouth 2 (two) times daily.       No Known Allergies Follow-up Information  [facility] Follow up on 05/21/2017.   Why:  Your appointment time is 2pm. Please arrive 15 min early and bring current medications and picture ID.  Contact information: [address] [place] [address]           The results of significant diagnostics from this hospitalization (including imaging, microbiology, ancillary and laboratory) are listed below for reference.    Significant Diagnostic Studies: Nm Myocar Multi W/spect W/wall Motion / Ef  Result Date: 05/13/2017 CLINICAL DATA:  Left chest pain. History of cardiac stent, diabetes  and atrial fibrillation. EXAM: MYOCARDIAL IMAGING WITH SPECT (REST AND PHARMACOLOGIC-STRESS) GATED LEFT VENTRICULAR WALL MOTION STUDY LEFT VENTRICULAR EJECTION FRACTION TECHNIQUE: Standard myocardial SPECT imaging was performed after resting intravenous injection of 10 mCi Tc-32m tetrofosmin. Subsequently, intravenous infusion of Lexiscan was performed under the supervision of the Cardiology staff. At peak effect of the drug, 30 mCi Tc-90m tetrofosmin was injected intravenously and standard myocardial SPECT imaging was performed. Quantitative gated imaging was also performed to evaluate left ventricular wall motion, and estimate left ventricular ejection fraction. COMPARISON:  Chest radiographs 05/12/2017. FINDINGS: Perfusion: There is prominent subdiaphragmatic activity which limits evaluation of the inferior and inferolateral walls. There is a small reversible perfusion defect in the distal inferolateral wall, which may be artifactual. However, myocardial ischemia in this area is difficult to exclude. No other reversible perfusion defects. Wall Motion: Normal left ventricular wall motion. No left ventricular dilation. Left Ventricular Ejection Fraction: 61 % End diastolic volume 123 ml End systolic volume 48 ml IMPRESSION: 1. Limited evaluation of the inferior and inferolateral walls due to prominent subdiaphragmatic activity. Ischemia in the distal inferolateral wall difficult to exclude. 2. Normal left ventricular wall motion. 3. Left ventricular ejection fraction 61% 4. Non invasive risk stratification*: Low *2012 Appropriate Use Criteria for Coronary Revascularization Focused Update: J Am Coll Cardiol. 2012;59(9):857-881. http://content.dementiazones.com.aspx?articleid=[email or web address] Electronically Signed   By: Carey Bullocks M.D.   On: 05/13/2017 14:16   Dg Chest Portable 1 View  Result Date: 05/12/2017 CLINICAL DATA:  Hyperglycemia.  Hypertension EXAM: PORTABLE CHEST 1 VIEW COMPARISON:  January 19, 2016  FINDINGS: There is no edema or consolidation. Heart is upper normal in size with pulmonary vascularity within normal limits. No adenopathy. No bone lesions. IMPRESSION: No edema or consolidation. Electronically Signed   By: Bretta Bang III M.D.   On: 05/12/2017 16:19    Microbiology: Recent Results (from the past 240 hour(s))  MRSA PCR Screening     Status: None   Collection Time: 05/12/17 11:38 PM  Result Value Ref Range Status   MRSA by PCR NEGATIVE NEGATIVE Final    Comment:        The GeneXpert MRSA Assay (FDA approved for NASAL specimens only), is one component of a comprehensive MRSA colonization surveillance program. It is not intended to diagnose MRSA infection nor to guide or monitor treatment for MRSA infections.      Labs: Basic Metabolic Panel:  Recent Labs Lab 05/12/17 1625  NA 139  K 3.7  CL 108  CO2 24  GLUCOSE 274*  BUN 16  CREATININE 0.85  CALCIUM 8.5*   Liver Function Tests: No results for input(s): AST, ALT, ALKPHOS, BILITOT, PROT, ALBUMIN in the last 168 hours. No results for input(s): LIPASE, AMYLASE in the last 168 hours. No results for input(s): AMMONIA in the last 168 hours. CBC:  Recent Labs Lab 05/12/17 1625  WBC 8.2  HGB 9.6*  HCT 27.2*  MCV 92.8  PLT 261   Cardiac Enzymes:  Recent Labs Lab 05/12/17 1817  TROPONINI <0.03   BNP: BNP (last 3 results) No results for input(s): BNP in the last 8760 hours.  ProBNP (last 3 results) No results for input(s): PROBNP in the last 8760 hours.  CBG:  Recent Labs Lab 05/12/17 1628 05/12/17 2230 05/13/17 0822 05/13/17 1327 05/13/17 1625  GLUCAP 268* 198* 154* 201* 257*       Signed:  Rhetta Mura MD   [facility] 05/13/2017, 5:18 PM

## 2017-05-13 NOTE — Progress Notes (Addendum)
Reviewed stress test results. Ischemia cannot be excluded given subdiaphragmatic activity, but it does not appear to be a high risk study (Ischemia is not large in size, normal EF). Would like to start Isosorbide mononitrate 30 mg but unable to do so due to lightheadedness and features of POTS. Continue current metoprolol, ASA, lisinopril. Recommend high moderate to high intensity statin. Would recommend SL NTG for as needed use.   He does have a cardiologist in [place]. If he would like to stay in the area, will be happy to see him in [facility] for follow up. Decision for coronary angiogram/revascularizarion based on his symptoms on optimal medical therapy.  F/u w/me in clinic on Monday 05/20/2017 at 9:30 AM  Elder Negus, MD 05/11/2017, 1:29 PM [facility] [place] Pager: [phone] Office: [phone] If no answer Cell [phone]

## 2017-05-13 NOTE — Progress Notes (Signed)
Discharge note. Patient and significant other educated at bedside. Rn educated on medications and when to take them, potential side effects, follow up appointments, when to return to work, and when to seek medical attention. IV removed without complications.   Patient discharged by wheelchair with NT.

## 2017-05-13 NOTE — Care Management Note (Signed)
Case Management Note  Patient Details  Name: Todd Wagner MRN: [id number] Date of Birth: [birth year]  Subjective/Objective:                    Action/Plan: Pt discharging home with his spouse. Pt interested in having his PCP care transferred to a [facility] clinic. CM was able to get him an appointment at the [facility]. CM also went over the use of the [facility] pharmacy and provided them information on the pharmacy.  CM provided the patient a coupon for to assist with the cost of his nitroglycerin today.  Patient has transportation home.   Expected Discharge Date:  05/13/17               Expected Discharge Plan:  Home/Self Care  In-House Referral:     Discharge planning Services  CM Consult, Indigent Health Clinic, Medication Assistance  Post Acute Care Choice:    Choice offered to:     DME Arranged:    DME Agency:     HH Arranged:    HH Agency:     Status of Service:  Completed, signed off  If discussed at [employer], dates discussed:    Additional Comments:  Kermit Balo, RN 05/13/2017, 5:18 PM

## 2017-05-13 NOTE — Consult Note (Addendum)
CARDIOLOGY CONSULT NOTE  Patient ID: Todd Wagner MRN: [id number] DOB/AGE: 1961-06-29 56 y.o.  Admit date: 05/12/2017 Referring Physician  Pleas Koch, MD Primary Physician:  [facility] & Reason for Consultation  Chest pain  HPI: Todd Wagner is a 56 y/o.African-American male with patient reported CAD and prior stent in [place], Aflutter s/p ablation and not on anticoagulation, insulin-dependent diabetes mellitus, hypertension, presented to the hospital with episodes of chest pain.  He describes retrosternal and radiating to left arm. He works in a Facilities manager and pain is worse with ex and relieved with rest.  It also improved with SL NTG given to him in the ED. He also reports shortness associated with the chest pain.  Separately, he has left arm pain that persists even at rest. He was last seen by his Cardiologist Dr. Dory [facility] care in 07/2016. He last underwent a nuclear stress test in 05/2015 for resting chest pain that was reportedly normal.  Past Medical History:  Diagnosis Date  . Diabetes mellitus without complication (HCC)   . Hypertension      Past Surgical History:  Procedure Laterality Date  . APPENDECTOMY    . CORONARY ANGIOPLASTY WITH STENT PLACEMENT       History reviewed. No pertinent family history.   Social History: Social History   Social History  . Marital status: Single    Spouse name: N/A  . Number of children: N/A  . Years of education: N/A   Occupational History  . Not on file.   Social History Main Topics  . Smoking status: Never Smoker  . Smokeless tobacco: Not on file  . Alcohol use Yes  . Drug use: No  . Sexual activity: Not on file   Other Topics Concern  . Not on file   Social History Narrative  . No narrative on file     Prescriptions Prior to Admission  Medication Sig Dispense Refill Last Dose  . amitriptyline (ELAVIL) 50 MG tablet Take 50-150 mg by mouth at  bedtime.   05/11/2017 at Unknown time  . aspirin 81 MG tablet Take 81 mg by mouth daily.   Unknown  . atorvastatin (LIPITOR) 40 MG tablet Take 40 mg by mouth daily.   05/12/2017 at Unknown time  . insulin NPH-regular Human (NOVOLIN 70/30) (70-30) 100 UNIT/ML injection Inject 20-25 Units into the skin See admin instructions. Pt takes 25 units in am and 20 units at bedtime   05/09/2017  . lisinopril (PRINIVIL,ZESTRIL) 5 MG tablet Take 5 mg by mouth daily.   Past Week at Unknown time  . metFORMIN (GLUCOPHAGE) 500 MG tablet Take 500 mg by mouth 2 (two) times daily with a meal.   05/10/2017  . metoprolol tartrate (LOPRESSOR) 25 MG tablet Take 25 mg by mouth 2 (two) times daily.   05/12/2017 at 0800  . pregabalin (LYRICA) 150 MG capsule Take 150 mg by mouth 2 (two) times daily.   05/12/2017 at Unknown time    Review of Systems  Constitutional: Negative for chills, fever and malaise/fatigue.  Respiratory: Positive for shortness of breath. Negative for cough.   Cardiovascular: Positive for chest pain. Negative for palpitations, claudication, leg swelling and PND.  Gastrointestinal: Positive for diarrhea (Intermittent. Currently absent).  Genitourinary: Negative.   Musculoskeletal:       Left arm pain  Skin: Negative.   Neurological:       Lightheadedness  Endo/Heme/Allergies:       Uncontrolled IDDM  Psychiatric/Behavioral:  Negative.   All other systems reviewed and are negative.  Blood pressure 124/73, pulse (!) 57, temperature 98.6 F (37 C), temperature source Oral, resp. rate 16, height 6' (1.829 m), weight 75 kg (165 lb 5.5 oz), SpO2 98 %. Physical Exam  Nursing note and vitals reviewed. Constitutional: He is oriented to person, place, and time. He appears well-developed.  HENT:  Head: Normocephalic and atraumatic.  Eyes: Conjunctivae are normal.  Neck: No JVD present.  Cardiovascular: Regular rhythm.   No murmur heard. Bradycardia  Respiratory: Effort normal and breath sounds normal. He  has no wheezes. He has no rales. He exhibits tenderness (Left sided chest wall tenderness).  Musculoskeletal: He exhibits no edema.  Neurological: He is alert and oriented to person, place, and time.  No gross motor deficit   Skin: Skin is warm and dry.     Labs:  Lab Results  Component Value Date   WBC 8.2 05/12/2017   HGB 9.6 (L) 05/12/2017   HCT 27.2 (L) 05/12/2017   MCV 92.8 05/12/2017   PLT 261 05/12/2017    Recent Labs Lab 05/12/17 1625  NA 139  K 3.7  CL 108  CO2 24  BUN 16  CREATININE 0.85  CALCIUM 8.5*  GLUCOSE 274*    Lipid Panel  No results found for: CHOL, TRIG, HDL, CHOLHDL, VLDL, LDLCALC  BNP (last 3 results) No results for input(s): BNP in the last 8760 hours.  HEMOGLOBIN A1C No results found for: HGBA1C, MPG  Cardiac Panel (last 3 results)  Recent Labs  05/12/17 1817  TROPONINI <0.03    Lab Results  Component Value Date   TROPONINI <0.03 05/12/2017     TSH No results for input(s): TSH in the last 8760 hours.  EKG: Sinus bradycardia. Otherwise normal EKG Echo: Pending   Radiology: Dg Chest Portable 1 View  Result Date: 05/12/2017 CLINICAL DATA:  Hyperglycemia.  Hypertension EXAM: PORTABLE CHEST 1 VIEW COMPARISON:  January 19, 2016 FINDINGS: There is no edema or consolidation. Heart is upper normal in size with pulmonary vascularity within normal limits. No adenopathy. No bone lesions. IMPRESSION: No edema or consolidation. Electronically Signed   By: Bretta Bang III M.D.   On: 05/12/2017 16:19    Scheduled Meds: . aspirin EC  81 mg Oral Daily  . enoxaparin (LOVENOX) injection  40 mg Subcutaneous Q24H  . insulin aspart  0-15 Units Subcutaneous TID WC  . insulin aspart  0-5 Units Subcutaneous QHS  . insulin aspart  3 Units Subcutaneous TID WC  . insulin glargine  20 Units Subcutaneous QHS   Continuous Infusions: PRN Meds:.acetaminophen, amitriptyline, morphine injection, nitroGLYCERIN, ondansetron (ZOFRAN) IV  CARDIAC  STUDIES:  EKG: As above  ECHO:Pending  ASSESSMENT AND PLAN:   Todd Wagner is a 56 y/o.African-American male with patient reported CAD and prior stent in [place], Aflutter s/p ablation and not on anticoagulation, insulin-dependent diabetes mellitus, hypertension, presented to the hospital with episodes of chest pain.  Chest pain, CAD: Features of typical angina as well as reproducible chest pain Known CAD as reported by the patient with risk factors hypertension, IDDM Prior ischemia testing 05/2015 reportedly negative Recommend echocardiogram exercise/Lexiscan  Nuclear stress test.  H/o Atrial flutter s/p ablation: Not on anticoagulation  Hypertension: Controlled On lisinopril  IDDM: Consider statin Recommend fasting lipid panel  Anemia: Hb 9.6. While he reports diarrhea, no frank malena Needs workup  Elder Negus, MD 05/13/2017, 7:39 AM [facility] [place] Pager: [phone] Office: [phone] If no answer  Cell [phone]

## 2017-05-14 ENCOUNTER — Other Ambulatory Visit (HOSPITAL_COMMUNITY): Payer: Self-pay

## 2017-05-21 ENCOUNTER — Ambulatory Visit: Payer: Self-pay | Admitting: Family Medicine

## 2017-05-22 ENCOUNTER — Encounter (HOSPITAL_COMMUNITY): Payer: Self-pay | Admitting: Emergency Medicine

## 2017-05-22 ENCOUNTER — Emergency Department (HOSPITAL_COMMUNITY): Payer: Self-pay

## 2017-05-22 ENCOUNTER — Observation Stay (HOSPITAL_COMMUNITY)
Admission: EM | Admit: 2017-05-22 | Discharge: 2017-05-23 | Disposition: A | Discharge status: Home / Self Care | Payer: Self-pay | Attending: Cardiology | Admitting: Cardiology

## 2017-05-22 DIAGNOSIS — I10 Essential (primary) hypertension: Secondary | ICD-10-CM | POA: Insufficient documentation

## 2017-05-22 DIAGNOSIS — Z79899 Other long term (current) drug therapy: Secondary | ICD-10-CM | POA: Insufficient documentation

## 2017-05-22 DIAGNOSIS — M25512 Pain in left shoulder: Secondary | ICD-10-CM | POA: Insufficient documentation

## 2017-05-22 DIAGNOSIS — Z794 Long term (current) use of insulin: Secondary | ICD-10-CM | POA: Insufficient documentation

## 2017-05-22 DIAGNOSIS — Z7982 Long term (current) use of aspirin: Secondary | ICD-10-CM | POA: Insufficient documentation

## 2017-05-22 DIAGNOSIS — R0789 Other chest pain: Principal | ICD-10-CM | POA: Insufficient documentation

## 2017-05-22 DIAGNOSIS — Z955 Presence of coronary angioplasty implant and graft: Secondary | ICD-10-CM | POA: Insufficient documentation

## 2017-05-22 DIAGNOSIS — R001 Bradycardia, unspecified: Secondary | ICD-10-CM | POA: Insufficient documentation

## 2017-05-22 DIAGNOSIS — R079 Chest pain, unspecified: Secondary | ICD-10-CM | POA: Diagnosis present

## 2017-05-22 DIAGNOSIS — I4892 Unspecified atrial flutter: Secondary | ICD-10-CM | POA: Insufficient documentation

## 2017-05-22 DIAGNOSIS — E119 Type 2 diabetes mellitus without complications: Secondary | ICD-10-CM | POA: Insufficient documentation

## 2017-05-22 DIAGNOSIS — I25110 Atherosclerotic heart disease of native coronary artery with unstable angina pectoris: Secondary | ICD-10-CM | POA: Insufficient documentation

## 2017-05-22 LAB — BASIC METABOLIC PANEL
ANION GAP: 9 (ref 5–15)
BUN: 18 mg/dL (ref 6–20)
CALCIUM: 8.9 mg/dL (ref 8.9–10.3)
CO2: 23 mmol/L (ref 22–32)
Chloride: 102 mmol/L (ref 101–111)
Creatinine, Ser: 1.12 mg/dL (ref 0.61–1.24)
GFR calc Af Amer: >60 mL/min (ref >60)
GLUCOSE: 159 mg/dL — AB (ref 65–99)
Potassium: 4.4 mmol/L (ref 3.5–5.1)
Sodium: 134 mmol/L — ABNORMAL LOW (ref 135–145)

## 2017-05-22 LAB — CBC
HCT: 31.1 % — ABNORMAL LOW (ref 39.0–52.0)
Hemoglobin: 10.5 g/dL — ABNORMAL LOW (ref 13.0–17.0)
MCH: 32 pg (ref 26.0–34.0)
MCHC: 33.8 g/dL (ref 30.0–36.0)
MCV: 94.8 fL (ref 78.0–100.0)
PLATELETS: 264 10*3/uL (ref 150–400)
RBC: 3.28 MIL/uL — ABNORMAL LOW (ref 4.22–5.81)
RDW: 11.8 % (ref 11.5–15.5)
WBC: 7.3 10*3/uL (ref 4.0–10.5)

## 2017-05-22 LAB — I-STAT TROPONIN, ED: Troponin i, poc: 0.01 ng/mL (ref 0.00–0.08)

## 2017-05-22 MED ORDER — NITROGLYCERIN 0.4 MG SL SUBL
0.4000 mg | SUBLINGUAL_TABLET | SUBLINGUAL | Status: DC | PRN
  Administered 2017-05-22 (×3): 0.4 mg via SUBLINGUAL
  Filled 2017-05-22: qty 1

## 2017-05-22 NOTE — ED Notes (Signed)
Patient transported to X-ray 

## 2017-05-22 NOTE — ED Triage Notes (Signed)
Pt BIB EMS from home. Reports having CP, SOB that began around 2000 tonight. [facility] and scheduled for the 18th for an "exploratory cath". Previous hx of stent placement. Initial presentation with 10/10 R-sided CP, which decr'd with ASA & Nitro x2 by EMS. Now c/o L-sided CP that is 7/10 without radiation. Reports nausea, dizziness, lightheaded, tingling to legs & feet. Placed on 2L via [place] by EMS for comfort, though maintaining sats on RA.

## 2017-05-22 NOTE — ED Provider Notes (Signed)
[facility]-EMERGENCY DEPT Provider Note   CSN: [id number] Arrival date & time: 05/22/17  2145     History   Chief Complaint Chief Complaint  Patient presents with  . Chest Pain  . Shortness of Breath    HPI Todd Wagner is a 56 y.o. male.  HPI  56 y.o. male with a hx of DM, HTN, prior CAD with stent placement in [facility], Aflutter s/p ablation and not on anticoagulation, presents to the Emergency Department today due to chest pain, shortness of breath that began around 2000 while cooking dinner. Notes 10/10 right sided chest pain. Decreased with NTG x 2 as well as ASA. Now complains of left sided CP that he rates 7/10 without radiation. Notes nausea, dizziness. Noted diaphoresis with onset of chest pain. States pain is intermittent and lasts 5 min at a time. No numbness/tingling. No fevers. No cough/congestion. Seen on 05-02-17 for same. Admitted for CP rule out with negative serial Troponins. Stress test was done and ischemia could not be excluded given subdiaphragmatic activity. Pt currently on metoprolol, ASA, Lisinopril. No other symptoms noted.     Cardiology- Dr. Truett Mainland  Past Medical History:  Diagnosis Date  . Diabetes mellitus without complication (HCC)   . Hypertension     Patient Active Problem List   Diagnosis Date Noted  . Chest pain 05/12/2017  . Essential hypertension 05/12/2017  . Insulin dependent diabetes mellitus (HCC) 05/12/2017  . Normocytic anemia 05/12/2017  . History of atrial fibrillation without current medication 05/12/2017    Past Surgical History:  Procedure Laterality Date  . APPENDECTOMY    . CORONARY ANGIOPLASTY WITH STENT PLACEMENT         Home Medications    Prior to Admission medications   Medication Sig Start Date End Date Taking? Authorizing Provider  amitriptyline (ELAVIL) 50 MG tablet Take 50-150 mg by mouth at bedtime.    [provider]  aspirin 81 MG tablet Take 81 mg by mouth daily.    [provider]  atorvastatin (LIPITOR) 40 MG tablet Take 40 mg by mouth daily.    [provider]  insulin NPH-regular Human (NOVOLIN 70/30) (70-30) 100 UNIT/ML injection Inject 20-25 Units into the skin See admin instructions. Pt takes 25 units in am and 20 units at bedtime    [provider]  lisinopril (PRINIVIL,ZESTRIL) 5 MG tablet Take 5 mg by mouth daily. 10/29/16 10/29/17  [provider]  metFORMIN (GLUCOPHAGE) 500 MG tablet Take 500 mg by mouth 2 (two) times daily with a meal.    [provider]  metoprolol tartrate (LOPRESSOR) 25 MG tablet Take 25 mg by mouth 2 (two) times daily.    [provider]  nitroGLYCERIN (NITROSTAT) 0.4 MG SL tablet Place 1 tablet (0.4 mg total) under the tongue every 5 (five) minutes x 3 doses as needed for chest pain. 05/13/17   Rhetta Mura, MD  pregabalin (LYRICA) 150 MG capsule Take 150 mg by mouth 2 (two) times daily.    [provider]    Family History No family history on file.  Social History Social History  Substance Use Topics  . Smoking status: Never Smoker  . Smokeless tobacco: Never Used  . Alcohol use Yes     Allergies   Patient has no known allergies.   Review of Systems Review of Systems ROS reviewed and all are negative for acute change except as noted in the HPI.  Physical Exam Updated Vital Signs BP 111/68 (BP  Location: Left Arm)   Pulse 64   Temp 97.9 F (36.6 C) (Oral)   Resp (!) 27   Ht 6' (1.829 m)   Wt 77.1 kg (170 lb)   SpO2 98%   BMI 23.06 kg/m   Physical Exam  Constitutional: He is oriented to person, place, and time. Vital signs are normal. He appears well-developed and well-nourished.  HENT:  Head: Normocephalic and atraumatic.  Right Ear: Hearing normal.  Left Ear: Hearing normal.  Eyes: Pupils are equal, round, and reactive to light. Conjunctivae and EOM are normal.  Neck: Normal range of motion. Neck supple.  Cardiovascular: Normal rate, regular rhythm,  normal heart sounds and intact distal pulses.   Pulmonary/Chest: Effort normal and breath sounds normal.  Chest pain reproducible on left. No palpable or visible deformities.   Musculoskeletal: Normal range of motion.  Neurological: He is alert and oriented to person, place, and time.  Skin: Skin is warm and dry.  Psychiatric: He has a normal mood and affect. His speech is normal and behavior is normal. Thought content normal.  Nursing note and vitals reviewed.  ED Treatments / Results  Labs (all labs ordered are listed, but only abnormal results are displayed) Labs Reviewed  CBC  BASIC METABOLIC PANEL  I-STAT TROPONIN, ED    EKG  EKG Interpretation  Date/Time:  Wednesday May 22 2017 22:00:15 EDT Ventricular Rate:  63 PR Interval:    QRS Duration: 72 QT Interval:  410 QTC Calculation: 420 R Axis:   45 Text Interpretation:  Sinus rhythm Abnormal R-wave progression, early transition No significant change since last tracing Borderline ECG Confirmed by Gerhard Munch [phone]) on 05/22/2017 10:13:29 PM       Radiology Dg Chest 2 View  Result Date: 05/22/2017 CLINICAL DATA:  Chest pain.  Shortness of breath. EXAM: CHEST  2 VIEW COMPARISON:  05/12/2017 FINDINGS: Cardiomediastinal silhouette is normal. Mediastinal contours appear intact. Elevation of the left hemidiaphragm likely due to gaseous distension of the stomach and/or transverse colon. There is no evidence of focal airspace consolidation, pleural effusion or pneumothorax. Osseous structures are without acute abnormality. Soft tissues are grossly normal. IMPRESSION: No active cardiopulmonary disease. Elevation of the hemidiaphragm likely due to gaseous distension of the stomach and/ or transverse colon. Electronically Signed   By: Ted Mcalpine M.D.   On: 05/22/2017 22:24    Procedures Procedures (including critical care time)  Medications Ordered in ED Medications  nitroGLYCERIN (NITROSTAT) SL tablet 0.4 mg (0.4  mg Sublingual Given 05/22/17 2323)    Initial Impression / Assessment and Plan / ED Course  I have reviewed the triage vital signs and the nursing notes.  Pertinent labs & imaging results that were available during my care of the patient were reviewed by me and considered in my medical decision making (see chart for details).  Final Clinical Impressions(s) / ED Diagnoses  {I have reviewed and evaluated the relevant laboratory values. {I have reviewed and evaluated the relevant imaging studies. {I have interpreted the relevant EKG. {I have reviewed the relevant previous healthcare records. {I have reviewed EMS Documentation. {I obtained HPI from historian. {Patient discussed with supervising physician.  ED Course:  Assessment: Pt is a 56 y.o. male with a hx of DM, HTN, prior CAD with stent placement in [facility], Aflutter s/p ablation and not on anticoagulation, presents to the Emergency Department today due to chest pain, shortness of breath that began around 2000 while cooking dinner. Notes 10/10 right sided chest pain. Decreased  with NTG x 2 as well as ASA. Now complains of left sided CP that he rates 7/10 without radiation. Notes nausea, dizziness. Noted diaphoresis with onset of chest pain. States pain is intermittent and lasts 5 min at a time. No numbness/tingling. No fevers. No cough/congestion. Seen on 05-02-17 for same. Admitted for CP rule out with negative serial Troponins. Stress test was done and ischemia could not be excluded given subdiaphragmatic activity. Pt currently on metoprolol, ASA, Lisinopril. Pt scheduled to have Cath in September. On exam, pt in NAD. Nontoxic/nonseptic appearing. VSS. Afebrile. Lungs CTA. Heart RRR. Abdomen nontender soft. Trop negative. Labs unremarkable. EKG NSR. CXR unremarkable. Given NTG with improvement in ED. Consult to Dr. Rosemary HolmsPatwardhan. Plan is to Admit. Likely cath tomorrow.   Disposition/Plan:  Admit Pt acknowledges and agrees with plan  Supervising  Physician Gerhard MunchLockwood, Robert, MD  Final diagnoses:  Chest pain, unspecified type    New Prescriptions New Prescriptions   No medications on file     Wilber BihariMohr, Tienna Bienkowski, PA-C 05/22/17 2356    Gerhard MunchLockwood, Robert, MD 05/28/17 [phone]

## 2017-05-23 ENCOUNTER — Other Ambulatory Visit (HOSPITAL_COMMUNITY): Payer: Self-pay

## 2017-05-23 ENCOUNTER — Encounter (HOSPITAL_COMMUNITY)
Admission: EM | Disposition: A | Discharge status: Home / Self Care | Payer: Self-pay | Source: Home / Self Care | Attending: Emergency Medicine

## 2017-05-23 ENCOUNTER — Encounter (HOSPITAL_COMMUNITY): Payer: Self-pay | Admitting: Cardiology

## 2017-05-23 HISTORY — PX: LEFT HEART CATH AND CORONARY ANGIOGRAPHY: CATH118249

## 2017-05-23 LAB — PROTIME-INR
INR: 1.08
Prothrombin Time: 13.9 seconds (ref 11.4–15.2)

## 2017-05-23 LAB — HIV ANTIBODY (ROUTINE TESTING W REFLEX): HIV Screen 4th Generation wRfx: NONREACTIVE

## 2017-05-23 LAB — HEMOGLOBIN A1C
Hgb A1c MFr Bld: 7.2 % — ABNORMAL HIGH (ref 4.8–5.6)
Mean Plasma Glucose: 159.94 mg/dL

## 2017-05-23 LAB — TROPONIN I

## 2017-05-23 LAB — GLUCOSE, CAPILLARY: Glucose-Capillary: 196 mg/dL — ABNORMAL HIGH (ref 65–99)

## 2017-05-23 SURGERY — LEFT HEART CATH AND CORONARY ANGIOGRAPHY
Anesthesia: LOCAL

## 2017-05-23 MED ORDER — HEPARIN (PORCINE) IN NACL 2-0.9 UNIT/ML-% IJ SOLN
INTRAMUSCULAR | Status: AC | PRN
  Administered 2017-05-23: 1000 mL

## 2017-05-23 MED ORDER — MIDAZOLAM HCL 2 MG/2ML IJ SOLN
INTRAMUSCULAR | Status: DC | PRN
  Administered 2017-05-23: 1 mg via INTRAVENOUS

## 2017-05-23 MED ORDER — HEPARIN (PORCINE) IN NACL 2-0.9 UNIT/ML-% IJ SOLN
INTRAMUSCULAR | Status: AC
  Filled 2017-05-23: qty 1000

## 2017-05-23 MED ORDER — ACETAMINOPHEN 325 MG PO TABS
650.0000 mg | ORAL_TABLET | ORAL | Status: DC | PRN
  Administered 2017-05-23: 650 mg via ORAL
  Filled 2017-05-23: qty 2

## 2017-05-23 MED ORDER — SODIUM CHLORIDE 0.9% FLUSH: 3.0000 mL | Freq: Two times a day (BID) | INTRAVENOUS | Status: DC

## 2017-05-23 MED ORDER — HEPARIN SODIUM (PORCINE) 1000 UNIT/ML IJ SOLN
INTRAMUSCULAR | Status: DC | PRN
  Administered 2017-05-23: 4000 [IU] via INTRAVENOUS

## 2017-05-23 MED ORDER — SODIUM CHLORIDE 0.9 % IV SOLN
INTRAVENOUS | Status: AC
  Administered 2017-05-23: 02:00:00 via INTRAVENOUS

## 2017-05-23 MED ORDER — LISINOPRIL 10 MG PO TABS: 5.0000 mg | ORAL_TABLET | Freq: Every day | ORAL | Status: DC

## 2017-05-23 MED ORDER — LIDOCAINE HCL (PF) 1 % IJ SOLN
INTRAMUSCULAR | Status: AC
  Filled 2017-05-23: qty 30

## 2017-05-23 MED ORDER — VERAPAMIL HCL 2.5 MG/ML IV SOLN
INTRAVENOUS | Status: AC
  Filled 2017-05-23: qty 2

## 2017-05-23 MED ORDER — HEPARIN SODIUM (PORCINE) 1000 UNIT/ML IJ SOLN
INTRAMUSCULAR | Status: AC
  Filled 2017-05-23: qty 1

## 2017-05-23 MED ORDER — MIDAZOLAM HCL 2 MG/2ML IJ SOLN
INTRAMUSCULAR | Status: AC
  Filled 2017-05-23: qty 2

## 2017-05-23 MED ORDER — NITROGLYCERIN 1 MG/10 ML FOR IR/CATH LAB
INTRA_ARTERIAL | Status: AC
  Filled 2017-05-23: qty 10

## 2017-05-23 MED ORDER — PREGABALIN 25 MG PO CAPS
150.0000 mg | ORAL_CAPSULE | Freq: Two times a day (BID) | ORAL | Status: DC
  Administered 2017-05-23: 150 mg via ORAL
  Filled 2017-05-23 (×2): qty 1

## 2017-05-23 MED ORDER — LIDOCAINE HCL (PF) 1 % IJ SOLN
INTRAMUSCULAR | Status: DC | PRN
  Administered 2017-05-23: 2 mL

## 2017-05-23 MED ORDER — AMITRIPTYLINE HCL 25 MG PO TABS
50.0000 mg | ORAL_TABLET | Freq: Every day | ORAL | Status: DC
  Administered 2017-05-23: 150 mg via ORAL
  Filled 2017-05-23: qty 6

## 2017-05-23 MED ORDER — ONDANSETRON HCL 4 MG/2ML IJ SOLN: 4.0000 mg | Freq: Four times a day (QID) | INTRAMUSCULAR | Status: DC | PRN

## 2017-05-23 MED ORDER — INSULIN ASPART 100 UNIT/ML ~~LOC~~ SOLN: 0.0000 [IU] | Freq: Three times a day (TID) | SUBCUTANEOUS | Status: DC

## 2017-05-23 MED ORDER — SODIUM CHLORIDE 0.9% FLUSH: 3.0000 mL | INTRAVENOUS | Status: DC | PRN

## 2017-05-23 MED ORDER — VERAPAMIL HCL 2.5 MG/ML IV SOLN
INTRA_ARTERIAL | Status: DC | PRN
  Administered 2017-05-23: 7 mL via INTRA_ARTERIAL

## 2017-05-23 MED ORDER — ACETAMINOPHEN 325 MG PO TABS: 650.0000 mg | ORAL_TABLET | ORAL | Status: DC | PRN

## 2017-05-23 MED ORDER — ASPIRIN 81 MG PO CHEW
81.0000 mg | CHEWABLE_TABLET | Freq: Every day | ORAL | Status: DC
  Administered 2017-05-23: 81 mg via ORAL
  Filled 2017-05-23: qty 1

## 2017-05-23 MED ORDER — ATORVASTATIN CALCIUM 40 MG PO TABS: 40.0000 mg | ORAL_TABLET | Freq: Every day | ORAL | Status: DC

## 2017-05-23 MED ORDER — METOPROLOL TARTRATE 25 MG PO TABS
25.0000 mg | ORAL_TABLET | Freq: Two times a day (BID) | ORAL | Status: DC
  Administered 2017-05-23: 25 mg via ORAL
  Filled 2017-05-23: qty 1

## 2017-05-23 MED ORDER — FENTANYL CITRATE (PF) 100 MCG/2ML IJ SOLN
INTRAMUSCULAR | Status: AC
  Filled 2017-05-23: qty 2

## 2017-05-23 MED ORDER — IOPAMIDOL (ISOVUE-370) INJECTION 76%
INTRAVENOUS | Status: DC | PRN
  Administered 2017-05-23: 25 mL via INTRA_ARTERIAL

## 2017-05-23 MED ORDER — FENTANYL CITRATE (PF) 100 MCG/2ML IJ SOLN
INTRAMUSCULAR | Status: DC | PRN
  Administered 2017-05-23: 25 ug via INTRAVENOUS

## 2017-05-23 MED ORDER — SODIUM CHLORIDE 0.9 % IV SOLN
250.0000 mL | INTRAVENOUS | Status: DC | PRN
Start: 2017-05-23 — End: 2017-05-23

## 2017-05-23 MED ORDER — SODIUM CHLORIDE 0.9 % WEIGHT BASED INFUSION: 1.0000 mL/kg/h | INTRAVENOUS | Status: AC

## 2017-05-23 MED ORDER — IOPAMIDOL (ISOVUE-370) INJECTION 76%
INTRAVENOUS | Status: AC
  Filled 2017-05-23: qty 100

## 2017-05-23 SURGICAL SUPPLY — 9 items
CATH OPTITORQUE TIG 4.0 5F (CATHETERS) ×1 IMPLANT
DEVICE RAD COMP TR BAND LRG (VASCULAR PRODUCTS) ×1 IMPLANT
GLIDESHEATH SLEND A-KIT 6F 20G (SHEATH) ×1 IMPLANT
GUIDEWIRE INQWIRE 1.5J.035X260 (WIRE) IMPLANT
INQWIRE 1.5J .035X260CM (WIRE) ×2
KIT HEART LEFT (KITS) ×2 IMPLANT
PACK CARDIAC CATHETERIZATION (CUSTOM PROCEDURE TRAY) ×2 IMPLANT
TRANSDUCER W/STOPCOCK (MISCELLANEOUS) ×2 IMPLANT
TUBING CIL FLEX 10 FLL-RA (TUBING) ×2 IMPLANT

## 2017-05-23 NOTE — Discharge Summary (Signed)
Physician Discharge Summary  Patient ID: Todd Wagner MRN: [id number] DOB/AGE: March 31, 1961 56 y.o.  Admit date: 05/22/2017 Discharge date: 05/23/2017  Admission Diagnoses:  Discharge Diagnoses:  Active Problems:   Chest pain   Discharged Condition: stable  Hospital Course:   Todd Wagner a 56 y/o.African-Americanmalewith patient reported [facility], Aflutter s/p ablation and not on anticoagulation, insulin-dependent diabetes mellitus, hypertension, recently seen in the hospital with equivocal stress test and then subsequently in clinic for central chest pain along with left shoulder pain. Chest pain is responsive to NTG while shoulder pain is not. On 8/29 afternoon, he had another episode of central chest pain while cooking that resolved with 3 SL NTG. EKG and enzymes negative for ischemia.   He underwent cath for suspected unstable angina that showed patent distal RCA stent and otherwise normal coronary arteries and normal LVEF with normal filling pressures. I believe his left sided pain is musculoskeletal in etiology and I have recommended him to follow up with his primary care provider.   Consults: None  Significant Diagnostic Studies: Angiography:  Conclusion   Angiographically normal coronary arteries with no significant coronary artery disease Patent distal RCA stent 0% stenosis Normal filling pressures LVEF 55-60%  Chest pain is noncardiac in etiology     Treatments: Diagnostic cath  Discharge Exam: Blood pressure 103/67, pulse (!) 59, temperature 97.9 F (36.6 C), temperature source Oral, resp. rate 16, height 6' (1.829 m), weight 77.1 kg (170 lb), SpO2 98 %.   Nursing note and vitals reviewed. Constitutional: He is oriented to person, place, and time. He appears well-developed and well-nourished.  HENT:  Head: Normocephalic and atraumatic.  Neck: No JVD present.  Cardiovascular: Regular rhythm.  Radial cath site stable with no  bleeding, hematoma No murmur heard. Bradycardia  Respiratory: Effort normal and breath sounds normal. He has no wheezes. He has no rales.  GI: Soft. Bowel sounds are normal.  Musculoskeletal: Normal range of motion. He exhibits no edema.  Left shoulder point tenderness  Neurological: He is alert and oriented to person, place, and time.   Disposition: 01-Home or Self Care   Allergies as of 05/23/2017   No Known Allergies     Medication List    TAKE these medications   amitriptyline 50 MG tablet Commonly known as:  ELAVIL Take 50-150 mg by mouth at bedtime.   aspirin 81 MG tablet Take 81 mg by mouth daily.   atorvastatin 40 MG tablet Commonly known as:  LIPITOR Take 40 mg by mouth daily.   lisinopril 5 MG tablet Commonly known as:  PRINIVIL,ZESTRIL Take 5 mg by mouth daily.   metoprolol tartrate 25 MG tablet Commonly known as:  LOPRESSOR Take 25 mg by mouth 2 (two) times daily.   nitroGLYCERIN 0.4 MG SL tablet Commonly known as:  NITROSTAT Place 1 tablet (0.4 mg total) under the tongue every 5 (five) minutes x 3 doses as needed for chest pain.   pregabalin 150 MG capsule Commonly known as:  LYRICA Take 150 mg by mouth 2 (two) times daily.      Follow-up Information    Todd Wagner, Todd Haymer J, MD Follow up on 06/05/2017.   Specialty:  Cardiology Why:  8:30 AM Contact information: [address] [place] [address] [phone]           Signed: Elder NegusManish Wagner Maico Wagner 05/23/2017, 8:24 AM

## 2017-05-23 NOTE — ED Notes (Signed)
Pt first states that his pain was "okay", then suddenly adds that it's "just come back" and rates it a 7/10.

## 2017-05-23 NOTE — H&P (Addendum)
Todd Wagner is an 56 y.o. male.    Chief Complaint: Chest pain  HPI:   Todd Wagner a 56 y/o.African-Americanmalewith patient reported CAD and prior stent in [place] s/p ablation and not on anticoagulation, insulin-dependent diabetes mellitus, hypertension, recently seen in the hospital with equivocal stress test and then subsequently in clinic for central chest pain along with left shoulder pain. Chest pain is responsive to NTG while shoulder pain is not. On 8/29 afternoon, he had another episode of central chest pain while cooking that resolved with 3 SL NTG. EKG and enzymes negative for ischemia.   05/13/2017:  IMPRESSION: 1. Limited evaluation of the inferior and inferolateral walls due to prominent subdiaphragmatic activity. Ischemia in the distal inferolateral wall difficult to exclude.  2. Normal left ventricular wall motion.  3. Left ventricular ejection fraction 61%  4. Non invasive risk stratification*: Low  Past Medical History:  Diagnosis Date  . Diabetes mellitus without complication ([place])   . Hypertension     Past Surgical History:  Procedure Laterality Date  . APPENDECTOMY    . CORONARY ANGIOPLASTY WITH STENT PLACEMENT      History reviewed. No pertinent family history. Social History:  reports that he has never smoked. He has never used smokeless tobacco. He reports that he drinks alcohol. He reports that he does not use drugs.  Allergies: No Known Allergies   (Not in a hospital admission)  Results for orders placed or performed during the hospital encounter of 05/22/17 (from the past 48 hour(s))  CBC     Status: Abnormal   Collection Time: 05/22/17 11:16 PM  Result Value Ref Range   WBC 7.3 4.0 - 10.5 K/uL   RBC 3.28 (L) 4.22 - 5.81 MIL/uL   Hemoglobin 10.5 (L) 13.0 - 17.0 g/dL   HCT 31.1 (L) 39.0 - 52.0 %   MCV 94.8 78.0 - 100.0 fL   MCH 32.0 26.0 - 34.0 pg   MCHC 33.8 30.0 - 36.0 g/dL   RDW 11.8 11.5 - 15.5 %   Platelets  264 150 - 400 K/uL  Basic metabolic panel     Status: Abnormal   Collection Time: 05/22/17 11:16 PM  Result Value Ref Range   Sodium 134 (L) 135 - 145 mmol/L   Potassium 4.4 3.5 - 5.1 mmol/L    Comment: SLIGHT HEMOLYSIS   Chloride 102 101 - 111 mmol/L   CO2 23 22 - 32 mmol/L   Glucose, Bld 159 (H) 65 - 99 mg/dL   BUN 18 6 - 20 mg/dL   Creatinine, Ser 1.12 0.61 - 1.24 mg/dL   Calcium 8.9 8.9 - 10.3 mg/dL   GFR calc non Af Amer >60 >60 mL/min   GFR calc Af Amer >60 >60 mL/min    Comment: (NOTE) The eGFR has been calculated using the CKD EPI equation. This calculation has not been validated in all clinical situations. eGFR's persistently <60 mL/min signify possible Chronic Kidney Disease.    Anion gap 9 5 - 15  I-Stat Troponin, ED (not at [facility])     Status: None   Collection Time: 05/22/17 11:25 PM  Result Value Ref Range   Troponin i, poc 0.01 0.00 - 0.08 ng/mL   Comment 3            Comment: Due to the release kinetics of cTnI, a negative result within the first hours of the onset of symptoms does not rule out myocardial infarction with certainty. If myocardial infarction is still suspected, repeat  the test at appropriate intervals.    Dg Chest 2 View  Result Date: 05/22/2017 CLINICAL DATA:  Chest pain.  Shortness of breath. EXAM: CHEST  2 VIEW COMPARISON:  05/12/2017 FINDINGS: Cardiomediastinal silhouette is normal. Mediastinal contours appear intact. Elevation of the left hemidiaphragm likely due to gaseous distension of the stomach and/or transverse colon. There is no evidence of focal airspace consolidation, pleural effusion or pneumothorax. Osseous structures are without acute abnormality. Soft tissues are grossly normal. IMPRESSION: No active cardiopulmonary disease. Elevation of the hemidiaphragm likely due to gaseous distension of the stomach and/ or transverse colon. Electronically Signed   By: Fidela Salisbury M.D.   On: 05/22/2017 22:24   EKG 05/22/2017 Sinus rhythm  63 bpm. Normal axis, intervals, conduction No ischemic changes  Review of Systems  Constitutional: Negative for malaise/fatigue.  HENT: Negative.   Eyes: Negative for blurred vision.  Respiratory: Positive for shortness of breath.   Cardiovascular: Positive for chest pain.  Gastrointestinal: Negative for blood in stool.  Musculoskeletal:       Left shoulder pain   Skin: Negative for rash.  Neurological: Positive for headaches.       Lightheadedness   Psychiatric/Behavioral: The patient is nervous/anxious.   All other systems reviewed and are negative.   Blood pressure 107/67, pulse 66, temperature 97.9 F (36.6 C), temperature source Oral, resp. rate 16, height 6' (1.829 m), weight 77.1 kg (170 lb), SpO2 97 %. Physical Exam  Nursing note and vitals reviewed. Constitutional: He is oriented to person, place, and time. He appears well-developed and well-nourished.  HENT:  Head: Normocephalic and atraumatic.  Neck: No JVD present.  Cardiovascular: Regular rhythm.   No murmur heard. Bradycardia   Respiratory: Effort normal and breath sounds normal. He has no wheezes. He has no rales.  GI: Soft. Bowel sounds are normal.  Musculoskeletal: Normal range of motion. He exhibits no edema.  Left shoulder point tenderness  Neurological: He is alert and oriented to person, place, and time.     Assessment/Plan  Todd Wagner a 56 y/o.African-Americanmalewith patient reported CAD and prior stent in [place] s/p ablation and not on anticoagulation, insulin-dependent diabetes mellitus, hypertension, admitted with chest pain  Unstable angina: NPO. Plan for cath in am Pain control, NTG if tolerated by BP. Continue ASA/lipitor/metoprolol  DM: Hold morning NPH insulin Meal coverage A1C  Hypertension: Controlled. Continue metop;rolol  H/o atrial flutter: Not on anticoagulation   Todd Mormon, MD 05/23/2017, 12:52 AM

## 2017-05-23 NOTE — Discharge Instructions (Signed)
Resume metformin on 05/25/2017. Hold until then. Radial Site Care Refer to this sheet in the next few weeks. These instructions provide you with information about caring for yourself after your procedure. Your health care provider may also give you more specific instructions. Your treatment has been planned according to current medical practices, but problems sometimes occur. Call your health care provider if you have any problems or questions after your procedure. What can I expect after the procedure? After your procedure, it is typical to have the following:  Bruising at the radial site that usually fades within 1-2 weeks.  Blood collecting in the tissue (hematoma) that may be painful to the touch. It should usually decrease in size and tenderness within 1-2 weeks.  Follow these instructions at home:  Take medicines only as directed by your health care provider.  You may shower 24-48 hours after the procedure or as directed by your health care provider. Remove the bandage (dressing) and gently wash the site with plain soap and water. Pat the area dry with a clean towel. Do not rub the site, because this may cause bleeding.  Do not take baths, swim, or use a hot tub until your health care provider approves.  Check your insertion site every day for redness, swelling, or drainage.  Do not apply powder or lotion to the site.  Do not flex or bend the affected arm for 24 hours or as directed by your health care provider.  Do not push or pull heavy objects with the affected arm for 24 hours or as directed by your health care provider.  Do not lift over 10 lb (4.5 kg) for 5 days after your procedure or as directed by your health care provider.  Ask your health care provider when it is okay to: ? Return to work or school. ? Resume usual physical activities or sports. ? Resume sexual activity.  Do not drive home if you are discharged the same day as the procedure. Have someone else drive  you.  You may drive 24 hours after the procedure unless otherwise instructed by your health care provider.  Do not operate machinery or power tools for 24 hours after the procedure.  If your procedure was done as an outpatient procedure, which means that you went home the same day as your procedure, a responsible adult should be with you for the first 24 hours after you arrive home.  Keep all follow-up visits as directed by your health care provider. This is important. Contact a health care provider if:  You have a fever.  You have chills.  You have increased bleeding from the radial site. Hold pressure on the site. CALL 911 Get help right away if:  You have unusual pain at the radial site.  You have redness, warmth, or swelling at the radial site.  You have drainage (other than a small amount of blood on the dressing) from the radial site.  The radial site is bleeding, and the bleeding does not stop after 30 minutes of holding steady pressure on the site.  Your arm or hand becomes pale, cool, tingly, or numb. This information is not intended to replace advice given to you by your health care provider. Make sure you discuss any questions you have with your health care provider. Document Released: 10/13/2010 Document Revised: 02/16/2016 Document Reviewed: 03/29/2014 Elsevier Interactive Patient Education  2018 [employer]

## 2017-05-23 NOTE — Interval H&P Note (Signed)
History and Physical Interval Note:  05/23/2017 7:29 AM  Todd Wagner  has presented today for surgery, with the diagnosis of unstable angina  The various methods of treatment have been discussed with the patient and family. After consideration of risks, benefits and other options for treatment, the patient has consented to  Procedure(s): LEFT HEART CATH AND CORONARY ANGIOGRAPHY (N/A) as a surgical intervention .  The patient's history has been reviewed, patient examined, no change in status, stable for surgery.  I have reviewed the patient's chart and labs.  Questions were answered to the patient's satisfaction.    Cath Lab Visit (complete for each Cath Lab visit)  Clinical Evaluation Leading to the Procedure:   ACS: Yes.    Non-ACS:    Anginal Classification: CCS IV  Anti-ischemic medical therapy: Minimal Therapy (1 class of medications)  Non-Invasive Test Results: Intermediate-risk stress test findings: cardiac mortality 1-3%/year  Prior CABG: No previous CABG        Todd Wagner Todd Wagner

## 2017-05-23 NOTE — Progress Notes (Signed)
Working with family to find a ride home at DC// 1145. No ride available till a family member leaves work at [employer]5pm.  Agricultural consultantupervisor  Updated.

## 2017-06-11 ENCOUNTER — Ambulatory Visit (HOSPITAL_COMMUNITY): Admit: 2017-06-11 | Payer: MEDICAID | Admitting: Cardiology

## 2017-06-11 ENCOUNTER — Encounter (HOSPITAL_COMMUNITY): Payer: Self-pay

## 2017-06-11 SURGERY — LEFT HEART CATH AND CORONARY ANGIOGRAPHY
Anesthesia: LOCAL

## 2017-06-16 ENCOUNTER — Encounter (HOSPITAL_COMMUNITY): Payer: Self-pay | Admitting: Emergency Medicine

## 2017-06-16 ENCOUNTER — Emergency Department (HOSPITAL_COMMUNITY)
Admission: EM | Admit: 2017-06-16 | Discharge: 2017-06-16 | Disposition: A | Discharge status: Home / Self Care | Payer: Self-pay | Attending: Emergency Medicine | Admitting: Emergency Medicine

## 2017-06-16 ENCOUNTER — Emergency Department (HOSPITAL_COMMUNITY): Payer: Self-pay

## 2017-06-16 DIAGNOSIS — K2900 Acute gastritis without bleeding: Secondary | ICD-10-CM | POA: Insufficient documentation

## 2017-06-16 DIAGNOSIS — Z7982 Long term (current) use of aspirin: Secondary | ICD-10-CM | POA: Insufficient documentation

## 2017-06-16 DIAGNOSIS — Z794 Long term (current) use of insulin: Secondary | ICD-10-CM | POA: Insufficient documentation

## 2017-06-16 DIAGNOSIS — I10 Essential (primary) hypertension: Secondary | ICD-10-CM | POA: Insufficient documentation

## 2017-06-16 DIAGNOSIS — E119 Type 2 diabetes mellitus without complications: Secondary | ICD-10-CM | POA: Insufficient documentation

## 2017-06-16 DIAGNOSIS — R0789 Other chest pain: Secondary | ICD-10-CM | POA: Insufficient documentation

## 2017-06-16 DIAGNOSIS — Z79899 Other long term (current) drug therapy: Secondary | ICD-10-CM | POA: Insufficient documentation

## 2017-06-16 LAB — BASIC METABOLIC PANEL
Anion gap: 15 (ref 5–15)
BUN: 13 mg/dL (ref 6–20)
CHLORIDE: 101 mmol/L (ref 101–111)
CO2: 20 mmol/L — ABNORMAL LOW (ref 22–32)
Calcium: 9.5 mg/dL (ref 8.9–10.3)
Creatinine, Ser: 0.97 mg/dL (ref 0.61–1.24)
GFR calc non Af Amer: >60 mL/min (ref >60)
Glucose, Bld: 116 mg/dL — ABNORMAL HIGH (ref 65–99)
POTASSIUM: 4 mmol/L (ref 3.5–5.1)
SODIUM: 136 mmol/L (ref 135–145)

## 2017-06-16 LAB — I-STAT TROPONIN, ED: Troponin i, poc: 0.01 ng/mL (ref 0.00–0.08)

## 2017-06-16 LAB — CBC
HEMATOCRIT: 35.4 % — AB (ref 39.0–52.0)
HEMOGLOBIN: 11.9 g/dL — AB (ref 13.0–17.0)
MCH: 31.4 pg (ref 26.0–34.0)
MCHC: 33.6 g/dL (ref 30.0–36.0)
MCV: 93.4 fL (ref 78.0–100.0)
Platelets: 306 10*3/uL (ref 150–400)
RBC: 3.79 MIL/uL — AB (ref 4.22–5.81)
RDW: 12.3 % (ref 11.5–15.5)
WBC: 6.4 10*3/uL (ref 4.0–10.5)

## 2017-06-16 LAB — LIPASE, BLOOD: Lipase: 18 U/L (ref 11–51)

## 2017-06-16 LAB — HEPATIC FUNCTION PANEL
ALBUMIN: 4.4 g/dL (ref 3.5–5.0)
ALK PHOS: 91 U/L (ref 38–126)
ALT: 29 U/L (ref 17–63)
AST: 36 U/L (ref 15–41)
Bilirubin, Direct: 0.4 mg/dL (ref 0.1–0.5)
Indirect Bilirubin: 1.2 mg/dL — ABNORMAL HIGH (ref 0.3–0.9)
Total Bilirubin: 1.6 mg/dL — ABNORMAL HIGH (ref 0.3–1.2)
Total Protein: 7.3 g/dL (ref 6.5–8.1)

## 2017-06-16 LAB — ETHANOL: Alcohol, Ethyl (B): 38 mg/dL — ABNORMAL HIGH (ref <5)

## 2017-06-16 MED ORDER — ONDANSETRON 4 MG PO TBDP
4.0000 mg | ORAL_TABLET | Freq: Once | ORAL | Status: AC
  Administered 2017-06-16: 4 mg via ORAL
  Filled 2017-06-16: qty 1

## 2017-06-16 MED ORDER — PANTOPRAZOLE SODIUM 20 MG PO TBEC: 20.0000 mg | DELAYED_RELEASE_TABLET | Freq: Every day | ORAL | 0 refills | Status: DC

## 2017-06-16 MED ORDER — ONDANSETRON HCL 4 MG PO TABS: 4.0000 mg | ORAL_TABLET | Freq: Four times a day (QID) | ORAL | 0 refills | Status: AC | PRN

## 2017-06-16 MED ORDER — GI COCKTAIL ~~LOC~~
30.0000 mL | Freq: Once | ORAL | Status: AC
  Administered 2017-06-16: 30 mL via ORAL
  Filled 2017-06-16: qty 30

## 2017-06-16 NOTE — ED Provider Notes (Signed)
[facility]EM[facility]ENCY DEPT Provider Note   CSN: [id number] Arrival date & time: 06/16/17  0636     History   Chief Complaint Chief Complaint  Patient presents with  . Chest Pain    HPI Todd Wagner is a 56 y.o. male.  HPI Patient presents with central chest pain that woke him from sleep after midnight. Had associated nausea. States the pain is sharp and worse with deep breathing. Admits to drinking alcohol last night. Unsure whether he had dinner or not. Patient did smoke some marijuana. She denies aspirin and nitroglycerin in route by EMS without significant change in his symptoms. Since seen several times recently for chest pain. Had cardiac cath last month that showed no obstructive disease. Past Medical History:  Diagnosis Date  . Diabetes mellitus without complication (HCC)   . Hypertension     Patient Active Problem List   Diagnosis Date Noted  . Chest pain 05/12/2017  . Essential hypertension 05/12/2017  . Insulin dependent diabetes mellitus (HCC) 05/12/2017  . Normocytic anemia 05/12/2017    Past Surgical History:  Procedure Laterality Date  . APPENDECTOMY    . CORONARY ANGIOPLASTY WITH STENT PLACEMENT    . LEFT HEART CATH AND CORONARY ANGIOGRAPHY N/A 05/23/2017   Procedure: LEFT HEART CATH AND CORONARY ANGIOGRAPHY;  Surgeon: Elder Negus, MD;  Location: [facility];  Service: Cardiovascular;  Laterality: N/A;       Home Medications    Prior to Admission medications   Medication Sig Start Date End Date Taking? Authorizing Provider  amitriptyline (ELAVIL) 50 MG tablet Take 100 mg by mouth at bedtime as needed for sleep.    Yes [provider]  aspirin 81 MG tablet Take 81 mg by mouth daily.   Yes [provider]  atorvastatin (LIPITOR) 40 MG tablet Take 40 mg by mouth daily.   Yes [provider]  HYDROcodone-acetaminophen (NORCO/VICODIN) 5-325 MG tablet Take 1-2 tablets by mouth daily as needed for moderate pain.   Yes  [provider]  insulin NPH-regular Human (NOVOLIN 70/30) (70-30) 100 UNIT/ML injection Inject 20-25 Units into the skin See admin instructions. Inject 25 units SQ in the morning and inject 20 units SQ in the evening 05/23/17  Yes [provider]  lisinopril (PRINIVIL,ZESTRIL) 5 MG tablet Take 5 mg by mouth daily. 10/29/16 10/29/17 Yes [provider]  metoprolol tartrate (LOPRESSOR) 25 MG tablet Take 25 mg by mouth 2 (two) times daily.   Yes [provider]  nitroGLYCERIN (NITROSTAT) 0.4 MG SL tablet Place 1 tablet (0.4 mg total) under the tongue every 5 (five) minutes x 3 doses as needed for chest pain. 05/13/17  Yes Rhetta Mura, MD  pregabalin (LYRICA) 150 MG capsule Take 150 mg by mouth 2 (two) times daily.   Yes [provider]  ondansetron (ZOFRAN) 4 MG tablet Take 1 tablet (4 mg total) by mouth every 6 (six) hours as needed for nausea or vomiting. 06/16/17   Loren Racer, MD  pantoprazole (PROTONIX) 20 MG tablet Take 1 tablet (20 mg total) by mouth daily. 06/16/17   Loren Racer, MD    Family History No family history on file.  Social History Social History  Substance Use Topics  . Smoking status: Never Smoker  . Smokeless tobacco: Never Used  . Alcohol use Yes     Allergies   Patient has no known allergies.   Review of Systems Review of Systems  Constitutional: Negative for chills and fever.  Eyes: Negative for  visual disturbance.  Respiratory: Negative for cough and shortness of breath.   Cardiovascular: Positive for chest pain. Negative for palpitations and leg swelling.  Gastrointestinal: Positive for abdominal pain and nausea. Negative for constipation, diarrhea and vomiting.  Genitourinary: Negative for dysuria, flank pain and frequency.  Musculoskeletal: Negative for back pain and myalgias.  Skin: Negative for rash and wound.  Neurological: Negative for dizziness, weakness, light-headedness, numbness and  headaches.  Psychiatric/Behavioral: The patient is nervous/anxious.   All other systems reviewed and are negative.    Physical Exam Updated Vital Signs BP 123/71 (BP Location: Right Arm)   Pulse 75   Temp 98.5 F (36.9 C) (Oral)   Resp 17   Ht  (1.676 m)   Wt 77.1 kg (170 lb)   SpO2 100%   BMI 27.44 kg/m   Physical Exam  Constitutional: He is oriented to person, place, and time. He appears well-developed and well-nourished.  Anxious appearing  HENT:  Head: Normocephalic and atraumatic.  Mouth/Throat: Oropharynx is clear and moist. No oropharyngeal exudate.  Eyes: Pupils are equal, round, and reactive to light. EOM are normal.  Neck: Normal range of motion. Neck supple.  Cardiovascular: Normal rate and regular rhythm.  Exam reveals no gallop and no friction rub.   No murmur heard. Pulmonary/Chest: Effort normal and breath sounds normal. No respiratory distress. He has no wheezes. He has no rales. He exhibits tenderness.  Reproduced chest tenderness along the left sternal border. No crepitance or deformity.  Abdominal: Soft. Bowel sounds are normal. There is tenderness. There is no rebound and no guarding.  Patient with left upper quadrant tenderness to palpation. No rebound or guarding.  Musculoskeletal: Normal range of motion. He exhibits no edema or tenderness.  The lower extremity swelling, asymmetry or tenderness.  Lymphadenopathy:    He has no cervical adenopathy.  Neurological: He is alert and oriented to person, place, and time.  Moving all extremities without focal deficit. Sensation intact.  Skin: Skin is warm and dry. No rash noted. No erythema.  Nursing note and vitals reviewed.    ED Treatments / Results  Labs (all labs ordered are listed, but only abnormal results are displayed) Labs Reviewed  BASIC METABOLIC PANEL - Abnormal; Notable for the following:       Result Value   CO2 20 (*)    Glucose, Bld 116 (*)    All other components within normal  limits  CBC - Abnormal; Notable for the following:    RBC 3.79 (*)    Hemoglobin 11.9 (*)    HCT 35.4 (*)    All other components within normal limits  HEPATIC FUNCTION PANEL - Abnormal; Notable for the following:    Total Bilirubin 1.6 (*)    Indirect Bilirubin 1.2 (*)    All other components within normal limits  ETHANOL - Abnormal; Notable for the following:    Alcohol, Ethyl (B) 38 (*)    All other components within normal limits  LIPASE, BLOOD  RAPID URINE DRUG SCREEN, HOSP PERFORMED  I-STAT TROPONIN, ED    EKG  EKG Interpretation  Date/Time:  Sunday June 16 2017 06:39:33 EDT Ventricular Rate:  92 PR Interval:    QRS Duration: 73 QT Interval:  393 QTC Calculation: 487 R Axis:   71 Text Interpretation:  Sinus rhythm Ventricular premature complex Borderline prolonged QT interval When compared with ECG of 05/22/2017, QT has lengthened Confirmed by Dione Booze ([id number]) on 06/16/2017 6:44:49 AM  Radiology Dg Chest 2 View  Result Date: 06/16/2017 CLINICAL DATA:  Central chest pain EXAM: CHEST  2 VIEW COMPARISON:  05/22/2017 FINDINGS: The heart size and mediastinal contours are within normal limits. Both lungs are clear. The visualized skeletal structures are unremarkable. IMPRESSION: No active cardiopulmonary disease. Electronically Signed   By: Alcide Clever M.D.   On: 06/16/2017 07:13    Procedures Procedures (including critical care time)  Medications Ordered in ED Medications  gi cocktail (Maalox,Lidocaine,Donnatal) (30 mLs Oral Given 06/16/17 0737)  ondansetron (ZOFRAN-ODT) disintegrating tablet 4 mg (4 mg Oral Given 06/16/17 0737)     Initial Impression / Assessment and Plan / ED Course  I have reviewed the triage vital signs and the nursing notes.  Pertinent labs & imaging results that were available during my care of the patient were reviewed by me and considered in my medical decision making (see chart for details).     Patient's symptoms have  improved after GI cocktail. EKG without acute findings. Single negative troponin is sufficient to rule out cardiac injury. Recent negative cath. Symptoms suspicious for gastritis with reflux disease. Will start on PPI and have follow-up with gastroenterology. Advised to avoid NSAIDs, alcohol and spicy and acidic foods. Return precautions given.  Final Clinical Impressions(s) / ED Diagnoses   Final diagnoses:  Acute gastritis without hemorrhage, unspecified gastritis type  Atypical chest pain    New Prescriptions New Prescriptions   ONDANSETRON (ZOFRAN) 4 MG TABLET    Take 1 tablet (4 mg total) by mouth every 6 (six) hours as needed for nausea or vomiting.   PANTOPRAZOLE (PROTONIX) 20 MG TABLET    Take 1 tablet (20 mg total) by mouth daily.     Loren Racer, MD 06/16/17 1024

## 2017-06-16 NOTE — ED Triage Notes (Addendum)
Brought by ems from home.  C/o central chest pain that radiates up into right side of neck that woke him around 1230 this morning.  Also c/o nausea.  zofran  iv given via ems and ntg X 3.  Reports small amount of relief in pain.  CBG-106.  Per ems was hyperventilating at the scene.  Sat 100% on room air.  Placed on oxygen at 1L for comfort per patient request.  Hx of stents 5 years ago.  Reports smoking weed tonight to help pain without success.

## 2017-07-10 DIAGNOSIS — Z9889 Other specified postprocedural states: Secondary | ICD-10-CM | POA: Insufficient documentation

## 2017-11-02 DIAGNOSIS — I152 Hypertension secondary to endocrine disorders: Secondary | ICD-10-CM | POA: Insufficient documentation

## 2017-11-02 DIAGNOSIS — G894 Chronic pain syndrome: Secondary | ICD-10-CM | POA: Insufficient documentation

## 2018-03-21 DIAGNOSIS — M5440 Lumbago with sciatica, unspecified side: Secondary | ICD-10-CM | POA: Insufficient documentation

## 2018-06-08 DIAGNOSIS — B351 Tinea unguium: Secondary | ICD-10-CM | POA: Insufficient documentation

## 2018-07-10 IMAGING — DX DG CHEST 1V PORT
1 series · 1 of 1 positions shown · non-contrast
Comparison: January 19, 2016

CLINICAL DATA: Hyperglycemia.  Hypertension

EXAM:
PORTABLE CHEST 1 VIEW

[chest ap]
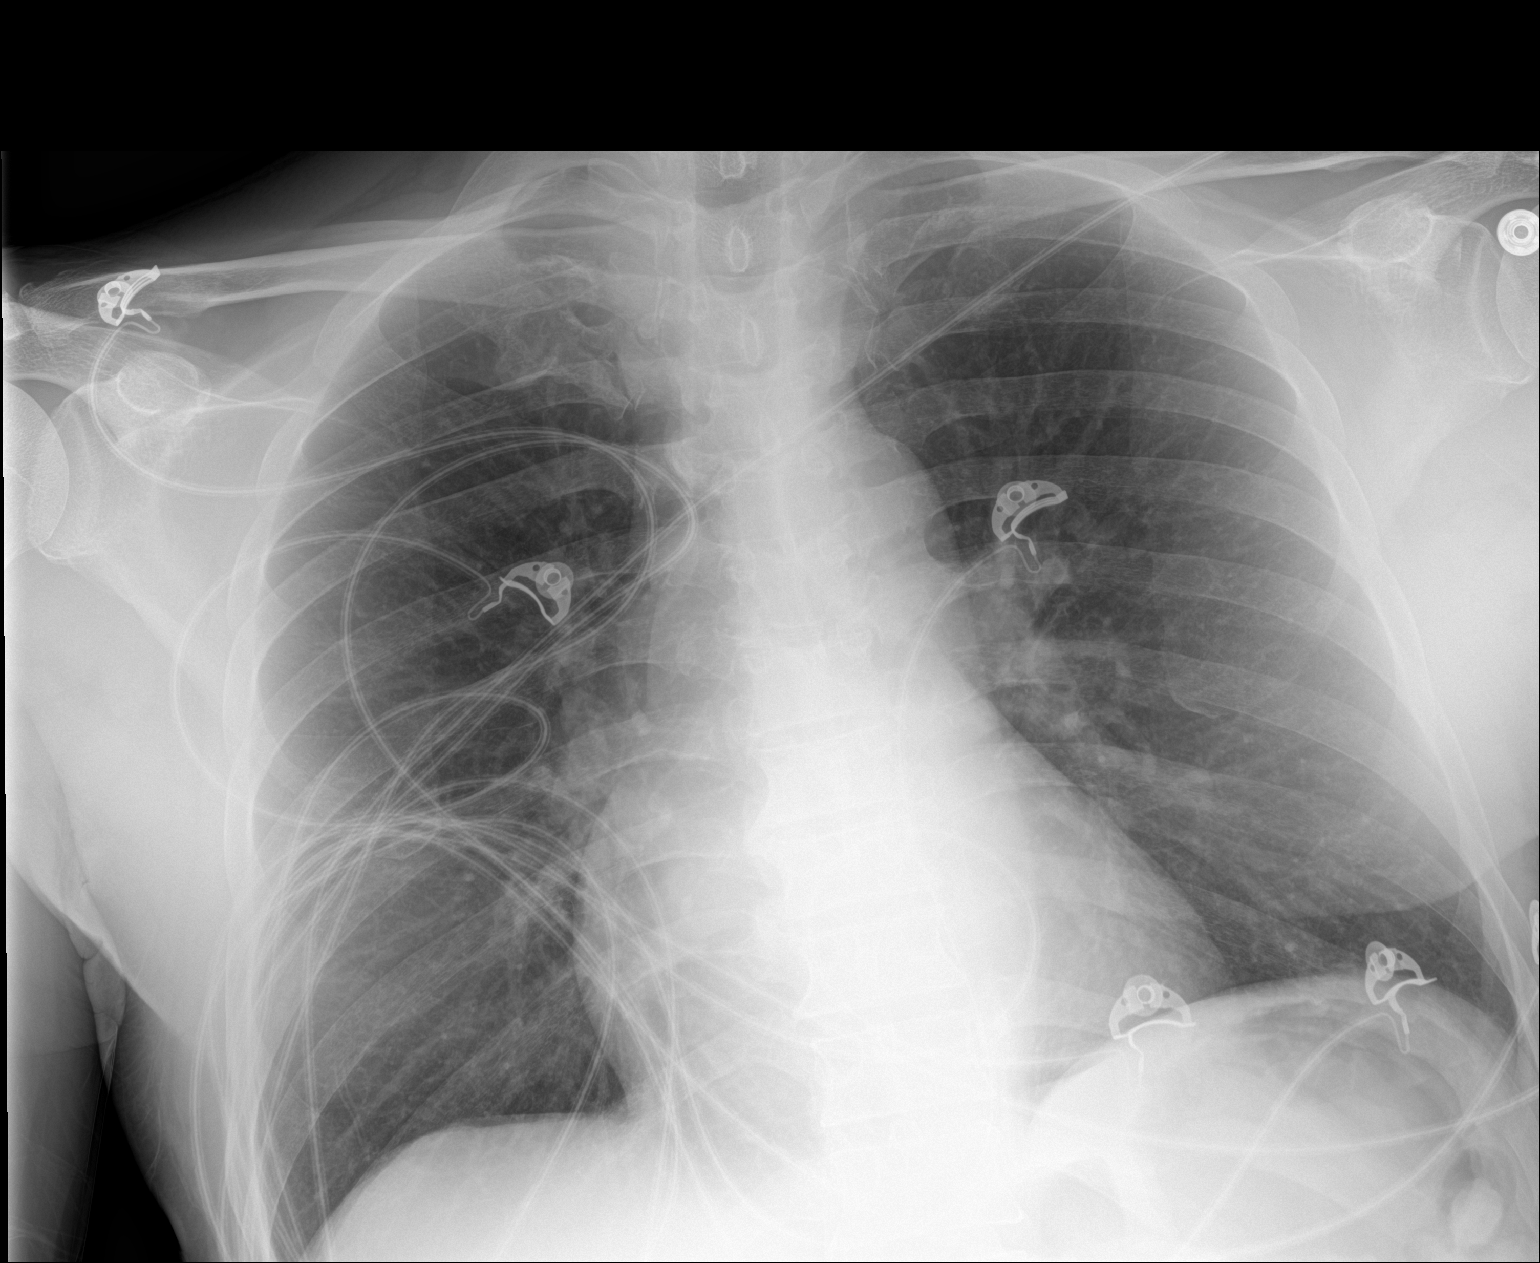

[1 of 1 positions shown; findings below may reference images not displayed]

FINDINGS: There is no edema or consolidation. Heart is upper normal in size
with pulmonary vascularity within normal limits. No adenopathy. No
bone lesions.
IMPRESSION: No edema or consolidation.

## 2019-03-26 DIAGNOSIS — I2510 Atherosclerotic heart disease of native coronary artery without angina pectoris: Secondary | ICD-10-CM | POA: Insufficient documentation

## 2019-03-26 DIAGNOSIS — Z599 Problem related to housing and economic circumstances, unspecified: Secondary | ICD-10-CM | POA: Insufficient documentation

## 2019-05-13 DIAGNOSIS — E1343 Other specified diabetes mellitus with diabetic autonomic (poly)neuropathy: Secondary | ICD-10-CM | POA: Insufficient documentation

## 2019-05-14 DIAGNOSIS — E113293 Type 2 diabetes mellitus with mild nonproliferative diabetic retinopathy without macular edema, bilateral: Secondary | ICD-10-CM | POA: Insufficient documentation

## 2019-06-01 DIAGNOSIS — R413 Other amnesia: Secondary | ICD-10-CM | POA: Insufficient documentation

## 2019-08-19 DIAGNOSIS — M722 Plantar fascial fibromatosis: Secondary | ICD-10-CM | POA: Insufficient documentation

## 2019-09-02 DIAGNOSIS — R519 Headache, unspecified: Secondary | ICD-10-CM | POA: Insufficient documentation

## 2019-09-02 DIAGNOSIS — G3184 Mild cognitive impairment, so stated: Secondary | ICD-10-CM | POA: Insufficient documentation

## 2019-12-17 DIAGNOSIS — K5900 Constipation, unspecified: Secondary | ICD-10-CM | POA: Insufficient documentation

## 2019-12-17 DIAGNOSIS — M25531 Pain in right wrist: Secondary | ICD-10-CM | POA: Insufficient documentation

## 2019-12-17 DIAGNOSIS — G8929 Other chronic pain: Secondary | ICD-10-CM | POA: Insufficient documentation

## 2020-01-18 DIAGNOSIS — R1012 Left upper quadrant pain: Secondary | ICD-10-CM | POA: Insufficient documentation

## 2020-04-08 DIAGNOSIS — R4689 Other symptoms and signs involving appearance and behavior: Secondary | ICD-10-CM | POA: Insufficient documentation

## 2021-05-10 DIAGNOSIS — M86271 Subacute osteomyelitis, right ankle and foot: Secondary | ICD-10-CM | POA: Insufficient documentation

## 2021-06-23 DIAGNOSIS — F419 Anxiety disorder, unspecified: Secondary | ICD-10-CM | POA: Insufficient documentation

## 2021-06-23 DIAGNOSIS — F1910 Other psychoactive substance abuse, uncomplicated: Secondary | ICD-10-CM | POA: Insufficient documentation

## 2022-09-21 DIAGNOSIS — M791 Myalgia, unspecified site: Secondary | ICD-10-CM | POA: Insufficient documentation

## 2022-09-21 DIAGNOSIS — M542 Cervicalgia: Secondary | ICD-10-CM | POA: Insufficient documentation

## 2022-10-30 DIAGNOSIS — M7632 Iliotibial band syndrome, left leg: Secondary | ICD-10-CM | POA: Insufficient documentation

## 2022-10-30 DIAGNOSIS — E119 Type 2 diabetes mellitus without complications: Secondary | ICD-10-CM | POA: Insufficient documentation

## 2022-10-30 DIAGNOSIS — Z9181 History of falling: Secondary | ICD-10-CM | POA: Insufficient documentation

## 2023-01-12 DIAGNOSIS — G4700 Insomnia, unspecified: Secondary | ICD-10-CM | POA: Insufficient documentation

## 2023-01-12 DIAGNOSIS — M47816 Spondylosis without myelopathy or radiculopathy, lumbar region: Secondary | ICD-10-CM | POA: Insufficient documentation

## 2023-02-04 DIAGNOSIS — M23204 Derangement of unspecified medial meniscus due to old tear or injury, left knee: Secondary | ICD-10-CM | POA: Insufficient documentation

## 2023-02-04 DIAGNOSIS — M19041 Primary osteoarthritis, right hand: Secondary | ICD-10-CM | POA: Insufficient documentation

## 2023-02-04 DIAGNOSIS — M48061 Spinal stenosis, lumbar region without neurogenic claudication: Secondary | ICD-10-CM | POA: Insufficient documentation

## 2023-02-04 DIAGNOSIS — K219 Gastro-esophageal reflux disease without esophagitis: Secondary | ICD-10-CM | POA: Insufficient documentation

## 2023-03-07 DIAGNOSIS — Z91199 Patient's noncompliance with other medical treatment and regimen due to unspecified reason: Secondary | ICD-10-CM | POA: Insufficient documentation

## 2023-03-07 DIAGNOSIS — R739 Hyperglycemia, unspecified: Secondary | ICD-10-CM | POA: Insufficient documentation

## 2023-03-07 DIAGNOSIS — M899 Disorder of bone, unspecified: Secondary | ICD-10-CM | POA: Insufficient documentation

## 2023-03-07 DIAGNOSIS — Z789 Other specified health status: Secondary | ICD-10-CM | POA: Insufficient documentation

## 2023-03-07 DIAGNOSIS — L608 Other nail disorders: Secondary | ICD-10-CM | POA: Insufficient documentation

## 2023-03-07 DIAGNOSIS — S90426A Blister (nonthermal), unspecified lesser toe(s), initial encounter: Secondary | ICD-10-CM | POA: Insufficient documentation

## 2023-03-07 DIAGNOSIS — Z79899 Other long term (current) drug therapy: Secondary | ICD-10-CM | POA: Insufficient documentation

## 2023-03-07 NOTE — Patient Instructions (Signed)
____________________________________________________________________________________________  New Patients  Welcome to [facility].   Initial Visit The first or initial visit consists of an evaluation only.   Interventional pain management.  We offer therapies other than opioid controlled substances to manage chronic pain. These include, but are not limited to, diagnostic, therapeutic, and palliative specialized injection therapies (i.e.: Epidural Steroids, Facet Blocks, etc.). We specialize in a variety of nerve blocks as well as radiofrequency treatments. We offer pain implant evaluations and trials, as well as follow up management. In addition we also provide a variety joint injections, including Viscosupplementation (AKA: Gel Therapy).  Prescription Pain Medication. We specialize in alternatives to opioids. We can provide evaluations and recommendations for/of pharmacologic therapies based on CDC Guidelines.  We no longer take patients for long-term medication management. We will not be taking over your pain medications.  ____________________________________________________________________________________________    ____________________________________________________________________________________________  Patient Information update  To: All of our patients.  Re: Name change.  It has been made official that our current name, "[facility]"   will soon be changed to "[facility]".   The purpose of this change is to eliminate any confusion created by the concept of our practice being a "Medication Management Pain Clinic". In the past this has led to the misconception that we treat pain primarily by the use of prescription medications.  Nothing can be farther from the truth.   Understanding PAIN MANAGEMENT: To  further understand what our practice does, you first have to understand that "Pain Management" is a subspecialty that requires additional training once a physician has completed their specialty training, which can be in either Anesthesia, Neurology, Psychiatry, or Physical Medicine and Rehabilitation (PMR). Each one of these contributes to the final approach taken by each physician to the management of their patient's pain. To be a "Pain Management Specialist" you must have first completed one of the specialty trainings below.  Anesthesiologists - trained in clinical pharmacology and interventional techniques such as nerve blockade and regional as well as central neuroanatomy. They are trained to block pain before, during, and after surgical interventions.  Neurologists - trained in the diagnosis and pharmacological treatment of complex neurological conditions, such as Multiple Sclerosis, Parkinson's, spinal cord injuries, and other systemic conditions that may be associated with symptoms that may include but are not limited to pain. They tend to rely primarily on the treatment of chronic pain using prescription medications.  Psychiatrist - trained in conditions affecting the psychosocial wellbeing of patients including but not limited to depression, anxiety, schizophrenia, personality disorders, addiction, and other substance use disorders that may be associated with chronic pain. They tend to rely primarily on the treatment of chronic pain using prescription medications.   Physical Medicine and Rehabilitation (PMR) physicians, also known as physiatrists - trained to treat a wide variety of medical conditions affecting the brain, spinal cord, nerves, bones, joints, ligaments, muscles, and tendons. Their training is primarily aimed at treating patients that have suffered injuries that have caused severe physical impairment. Their training is primarily aimed at the physical therapy and rehabilitation of those  patients. They may also work alongside orthopedic surgeons or neurosurgeons using their expertise in assisting surgical patients to recover after their surgeries.  INTERVENTIONAL PAIN MANAGEMENT is sub-subspecialty of Pain Management.  Our physicians are Board-certified in Anesthesia, Pain Management, and Interventional Pain Management.  This meaning that not only have they been trained   and Board-certified in their specialty of Anesthesia, and subspecialty of Pain Management, but they have also received further training in the sub-subspecialty of Interventional Pain Management, in order to become Board-certified as INTERVENTIONAL PAIN MANAGEMENT SPECIALIST.    Mission: Our goal is to use our skills in  INTERVENTIONAL PAIN MANAGEMENT as alternatives to the chronic use of prescription opioid medications for the treatment of pain. To make this more clear, we have changed our name to reflect what we do and offer. We will continue to offer medication management assessment and recommendations, but we will not be taking over any patient's medication management.  ____________________________________________________________________________________________     

## 2023-03-07 NOTE — Progress Notes (Signed)
Patient: Todd Wagner  Service Category: E/M  Provider: Oswaldo Done, MD  DOB: [birth year]  DOS: 03/11/2023  Referring Provider: Barron Alvine, MD  MRN: [id number]  Setting: Ambulatory outpatient  PCP: [facility]  Type: New Patient  Specialty: Interventional Pain Management    Location: Office  Delivery: Face-to-face     Primary Reason(s) for Visit: Encounter for initial evaluation of one or more chronic problems (new to examiner) potentially causing chronic pain, and posing a threat to normal musculoskeletal function. (Level of risk: High) CC: Hand Pain (Both hands; wrists; left knee; lower back)  HPI  Todd Wagner is a 62 y.o. year old, male patient, who comes for the first time to our practice referred by Barron Alvine, MD for our initial evaluation of his chronic pain. 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; Atrial fibrillation with rapid ventricular response (HCC); Behavioral change; Bunion of foot (Right); Toe blister without infection; Chronic shoulder pain (Left); Chronic pain syndrome; Constipation; Controlled type 2 diabetes mellitus with diabetic nephropathy, with long-term current use of insulin (HCC); Coronary artery disease; Diabetic nephropathy associated with type 2 diabetes mellitus (HCC); Financial difficulty; Neck pain; Headache; Healthcare maintenance; S/P ablation of atrial flutter; Homicidal ideation; Hyperlipidemia; Iliotibial band syndrome (Left); Insomnia; Left upper quadrant abdominal pain; Medical non-compliance; Mild cognitive impairment with memory loss; Mild nonproliferative diabetic retinopathy of both eyes without macular edema associated with type 2 diabetes mellitus (HCC); Onychomycosis; Osteoarthritis of lumbar spine; Chronic wrist pain (1ry area of  Pain) (Bilateral); Myalgia; Pain in joint; Plantar fascial fibromatosis of both feet; Polyneuropathy associated with underlying disease (HCC); Polysubstance abuse (HCC); Risk for falls; Short-term memory loss; Subacute osteomyelitis of foot (Right) (HCC); Toenail deformity; Hypertension complicating diabetes (HCC); Gastroparesis due to secondary diabetes (HCC); Hyperglycemia; Diabetes mellitus (HCC); Anemia; Pharmacologic therapy; Disorder of skeletal system; Problems influencing health status; Gastroesophageal reflux disease; Neuroforaminal stenosis of lumbar spine; Spinal stenosis of lumbar region; Old tear of medial meniscus of left knee; Osteoarthritis of hands (Bilateral); Chronic hand pain (1ry area of Pain) (Bilateral); Osteoarthritis of wrists (Bilateral); Low back pain of over 3 months duration; Chronic knee pain 3 (3ry area of Pain) (Left); History of alcohol abuse; History of marijuana use; Chronic low back pain (2ry area of Pain) (Midline) w/o sciatica; Lumbar facet joint pain; Chronic feet pain (5th area of Pain) (Bilateral) (R=L); Chronic lower extremity pain (4th area of Pain) (Bilateral) (R=L); and Abnormal MRI, lumbar spine (01/26/2023) on their problem list. Today he comes in for evaluation of his Hand Pain (Both hands; wrists; left knee; lower back)  Pain Assessment: Location: Right, Left, Other (Comment) Hand (wrists, left knee and lower back) Radiating: denies Onset: More than a month ago Duration: Chronic pain Quality: Constant, Sharp Severity: 10-Worst pain ever/10 (subjective, self-reported pain score)  Effect on ADL: limits ADLS Timing: Constant Modifying factors: denies BP: 127/67  HR: 70  Onset and Duration: Present longer than 3 months Cause of pain: Unknown Severity: Getting worse, NAS-11 at its worse: 10/10, NAS-11 now: 10/10, and NAS-11 on the average: 10/10 Timing: Morning, Afternoon, and Night Aggravating Factors: Bending, Kneeling, Lifiting, Nerve blocks,  Prolonged sitting, Prolonged standing, Stooping , Walking, Walking uphill, Walking downhill, and Working Alleviating Factors:  none listed Associated Problems: Depression, Nausea, Numbness, Weakness, Pain that wakes patient up, and Pain that does not allow patient to sleep Quality of  Pain: Aching, Burning, Distressing, Dreadful, Itching, Nagging, Pressure-like, Shooting, Tender, Throbbing, Tingling, and Uncomfortable Previous Examinations or Tests: EMG/PNCV, MRI scan, and X-rays Previous Treatments: The patient denies none listed  Todd Wagner is being evaluated for possible interventional pain management therapies for the treatment of his chronic pain.   According to the patient the primary area of pain is that of the hands and wrists (Bilateral) (R>L).  The patient indicates being right-handed.  He states having numbness in all of his fingers on both hands.  He stated that he was told that he would be referred for an EMG/PNCV of the upper extremities looking for the possibility of a peripheral neuropathy.  He describes this hand and wrist pain to have developed slowly over time and it has been present for the last 2 to 3 years.  Before then he indicates that the pain used to be more intermittent.  He denies any surgeries of the hands, physical therapy, or any nerve blocks.  He also denies any recent x-rays or nerve conduction test.  The patient's secondary area pain is out of the lower back (Midline).  He denies any surgery, physical therapy, or nerve blocks.  He describes having had an MRI approximately 2 months ago.  The patient's third area pain is out of the left knee.  He describes this to be secondary to some football injuries.  He denies any prior surgeries, injections, but he does admit to physical therapy and an MRI.  The patient's fourth area pain is that of the lower extremities (Bilateral) (R=L).  He describes the pain to be over the anterior shin area, bilaterally, all the way down to the  dorsum of his feet.  He denies the pain going into his toes.  The patient also denies any prior surgeries, nerve blocks, x-rays, nerve conduction test, or physical therapy for this pain.  The patient's fifth area pain is that of his feet (Bilateral) (R>L).  He describes this as a burning sensation over the top of his feet.  Physical exam: Today the patient was able to toe walk and heel walk without any major problems.  He does seem to have some degree of deconditioning.  Lumbar hyperextension as well as hyperextension on rotation maneuver did trigger the low back pain bilaterally.  Straight leg raise was negative.  Patrick maneuver was also negative bilaterally for hip joint or sacroiliac joint arthralgias.  Todd Wagner has been informed that this initial visit was an evaluation only.  On the follow up appointment I will go over the results, including ordered tests and available interventional therapies. At that time he will have the opportunity to decide whether to proceed with offered therapies or not. In the event that Todd Wagner prefers avoiding interventional options, this will conclude our involvement in the case.  Medication management recommendations may be provided upon request.  Historic Controlled Substance Pharmacotherapy Review  PMP and historical list of controlled substances: Gabapentin 300 mg capsule (# 270) (last filled on 02/22/2023); oxycodone IR 5 mg tablet, 1 tab 5 times a day (# 10) (last filled on 03/20/2022); oxycodone/APAP 5/325, 1 tab 5 times a day (# 20) (last filled on 03/14/2022) Most recently prescribed opioid analgesics:   None MME/day: 0 mg/day  Historical Monitoring: The patient  reports current drug use. Drug: Marijuana. List of prior UDS Testing: Lab Results  Component Value Date   ETH 38 (H) 06/16/2017   Historical Background Evaluation: [place] PMP: PDMP reviewed during this encounter. Review of the  past 16-months conducted.             PMP NARX Score Report:   Narcotic: 100 Sedative: 050 Stimulant: 000 [place] Department of public safety, offender search: Engineer, mining Information) Non-contributory Risk Assessment Profile: Aberrant behavior: None observed or detected today Risk factors for fatal opioid overdose: None identified today PMP NARX Overdose Risk Score: 320 Fatal overdose hazard ratio (HR): Calculation deferred Non-fatal overdose hazard ratio (HR): Calculation deferred Risk of opioid abuse or dependence: 0.7-3.0% with doses ? 36 MME/day and 6.1-26% with doses ? 120 MME/day. Substance use disorder (SUD) risk level: See below Personal History of Substance Abuse (SUD-Substance use disorder):  Alcohol: Positive Male or Male (self)  Illegal Drugs: Positive Male or Male (marijuana)  Rx Drugs: Negative  ORT Risk Level calculation: Moderate Risk  Opioid Risk Tool - 03/11/23 1018       Family History of Substance Abuse   Alcohol Negative    Illegal Drugs Negative    Rx Drugs Negative      Personal History of Substance Abuse   Alcohol Positive Male or Male   self   Illegal Drugs Positive Male or Male   marijuana   Rx Drugs Negative      Age   Age between 38-45 years  No      Psychological Disease   Psychological Disease Negative    Depression Negative      Total Score   Opioid Risk Tool Scoring 7    Opioid Risk Interpretation Moderate Risk            ORT Scoring interpretation table:  Score <3 = Low Risk for SUD  Score between 4-7 = Moderate Risk for SUD  Score >8 = High Risk for Opioid Abuse   PHQ-2 Depression Scale:  Total score:    PHQ-2 Scoring interpretation table: (Score and probability of major depressive disorder)  Score 0 = No depression  Score 1 = 15.4% Probability  Score 2 = 21.1% Probability  Score 3 = 38.4% Probability  Score 4 = 45.5% Probability  Score 5 = 56.4% Probability  Score 6 = 78.6% Probability   PHQ-9 Depression Scale:  Total score:    PHQ-9 Scoring interpretation table:  Score 0-4 =  No depression  Score 5-9 = Mild depression  Score 10-14 = Moderate depression  Score 15-19 = Moderately severe depression  Score 20-27 = Severe depression (2.4 times higher risk of SUD and 2.89 times higher risk of overuse)   Pharmacologic Plan: As per protocol, I have not taken over any controlled substance management, pending the results of ordered tests and/or consults.            Initial impression: Pending review of available data and ordered tests.  Meds   Current Outpatient Medications:    acetaminophen (TYLENOL) 500 MG tablet, Take 500 mg by mouth every 6 (six) hours as needed for mild pain., Disp: , Rfl:    amitriptyline (ELAVIL) 50 MG tablet, Take 100 mg by mouth at bedtime as needed for sleep. , Disp: , Rfl:    aspirin 81 MG tablet, Take 81 mg by mouth daily., Disp: , Rfl:    atorvastatin (LIPITOR) 40 MG tablet, Take 40 mg by mouth daily., Disp: , Rfl:    Blood Glucose Monitoring Suppl (ACCU-CHEK GUIDE ME) w/Device KIT, See admin instructions., Disp: , Rfl:    colchicine 0.6 MG tablet, Take 0.6 mg by mouth daily., Disp: , Rfl:    Continuous Glucose Sensor (DEXCOM G7  SENSOR) MISC, 1 Device by Miscellaneous route every ten (10) days., Disp: , Rfl:    diphenhydrAMINE (SOMINEX) 25 MG tablet, Take 25 mg by mouth at bedtime as needed for itching., Disp: , Rfl:    gabapentin (NEURONTIN) 300 MG capsule, Take 900 mg by mouth 3 (three) times daily., Disp: , Rfl:    glipiZIDE (GLUCOTROL) 5 MG tablet, Take 5 mg by mouth daily before breakfast., Disp: , Rfl:    HYDROcodone-acetaminophen (NORCO/VICODIN) 5-325 MG tablet, Take 1-2 tablets by mouth daily as needed for moderate pain., Disp: , Rfl:    insulin aspart protamine - aspart (NOVOLOG 70/30 MIX) (70-30) 100 UNIT/ML FlexPen, Inject 25 Units into the skin 2 (two) times daily with a meal., Disp: , Rfl:    LANTUS SOLOSTAR 100 UNIT/ML Solostar Pen, Inject 25 Units into the skin daily., Disp: , Rfl:    lisinopril (PRINIVIL,ZESTRIL) 5 MG tablet,  Take 5 mg by mouth daily., Disp: , Rfl:    Melatonin 3 MG CAPS, Take 3 mg by mouth daily., Disp: , Rfl:    meloxicam (MOBIC) 7.5 MG tablet, Take 7.5 mg by mouth daily., Disp: , Rfl:    metFORMIN (GLUCOPHAGE) 1000 MG tablet, Take 1,000 mg by mouth 2 (two) times daily with a meal., Disp: , Rfl:    metoprolol tartrate (LOPRESSOR) 25 MG tablet, Take 25 mg by mouth 2 (two) times daily., Disp: , Rfl:    mirtazapine (REMERON) 45 MG tablet, Take 45 mg by mouth at bedtime., Disp: , Rfl:    nitroGLYCERIN (NITROSTAT) 0.4 MG SL tablet, Place 1 tablet (0.4 mg total) under the tongue every 5 (five) minutes x 3 doses as needed for chest pain., Disp: 30 tablet, Rfl: 1   omeprazole (PRILOSEC) 20 MG capsule, Take 20 mg by mouth daily., Disp: , Rfl:    ondansetron (ZOFRAN) 4 MG tablet, Take 1 tablet (4 mg total) by mouth every 6 (six) hours as needed for nausea or vomiting., Disp: 12 tablet, Rfl: 0   traZODone (DESYREL) 50 MG tablet, Take 50 mg by mouth at bedtime., Disp: , Rfl:    VENTOLIN HFA 108 (90 Base) MCG/ACT inhaler, Inhale 2 puffs into the lungs every 6 (six) hours as needed for wheezing., Disp: , Rfl:   Imaging Review   Complexity Note: No results found under the [facility] electronic medical record.                         ROS  Cardiovascular: Heart trouble stent Pulmonary or Respiratory: Wheezing and difficulty taking a deep full breath (Asthma) and Shortness of breath Neurological: No reported neurological signs or symptoms such as seizures, abnormal skin sensations, urinary and/or fecal incontinence, being born with an abnormal open spine and/or a tethered spinal cord Psychological-Psychiatric: Depressed Gastrointestinal: Reflux or heatburn Genitourinary: No reported renal or genitourinary signs or symptoms such as difficulty voiding or producing urine, peeing blood, non-functioning kidney, kidney stones, difficulty emptying the bladder, difficulty controlling the flow of urine, or chronic  kidney disease Hematological: Weakness due to low blood hemoglobin or red blood cell count (Anemia) Endocrine: High blood sugar requiring insulin (IDDM) Rheumatologic: No reported rheumatological signs and symptoms such as fatigue, joint pain, tenderness, swelling, redness, heat, stiffness, decreased range of motion, with or without associated rash Musculoskeletal: Negative for myasthenia gravis, muscular dystrophy, multiple sclerosis or malignant hyperthermia Work History: Disabled  Allergies  Todd Wagner has No Known Allergies.  Laboratory Chemistry Profile   Renal Lab  Results  Component Value Date   BUN 13 06/16/2017   CREATININE 0.97 06/16/2017   GFRAA >60 06/16/2017   GFRNONAA >60 06/16/2017     Electrolytes Lab Results  Component Value Date   NA 136 06/16/2017   K 4.0 06/16/2017   CL 101 06/16/2017   CALCIUM 9.5 06/16/2017   MG 1.7 01/19/2016     Hepatic Lab Results  Component Value Date   AST 36 06/16/2017   ALT 29 06/16/2017   ALBUMIN 4.4 06/16/2017   ALKPHOS 91 06/16/2017   LIPASE 18 06/16/2017     ID Lab Results  Component Value Date   HIV Non Reactive 05/23/2017   MRSAPCR NEGATIVE 05/12/2017     Bone No results found for: "VD25OH", "VD125OH2TOT", "XB1478GN5", "AO1308MV7", "25OHVITD1", "25OHVITD2", "25OHVITD3", "TESTOFREE", "TESTOSTERONE"   Endocrine Lab Results  Component Value Date   GLUCOSE 116 (H) 06/16/2017   HGBA1C 7.2 (H) 05/23/2017     Neuropathy Lab Results  Component Value Date   HGBA1C 7.2 (H) 05/23/2017   HIV Non Reactive 05/23/2017     CNS No results found for: "COLORCSF", "APPEARCSF", "RBCCOUNTCSF", "WBCCSF", "POLYSCSF", "LYMPHSCSF", "EOSCSF", "PROTEINCSF", "GLUCCSF", "JCVIRUS", "CSFOLI", "IGGCSF", "LABACHR", "ACETBL"   Inflammation (CRP: Acute  ESR: Chronic) No results found for: "CRP", "ESRSEDRATE", "LATICACIDVEN"   Rheumatology No results found for: "RF", "ANA", "LABURIC", "URICUR", "LYMEIGGIGMAB", "LYMEABIGMQN", "HLAB27"    Coagulation Lab Results  Component Value Date   INR 1.08 05/23/2017   LABPROT 13.9 05/23/2017   PLT 306 06/16/2017     Cardiovascular Lab Results  Component Value Date   TROPONINI <0.03 05/23/2017   HGB 11.9 (L) 06/16/2017   HCT 35.4 (L) 06/16/2017     Screening Lab Results  Component Value Date   MRSAPCR NEGATIVE 05/12/2017   HIV Non Reactive 05/23/2017     Cancer No results found for: "CEA", "CA125", "LABCA2"   Allergens No results found for: "ALMOND", "APPLE", "ASPARAGUS", "AVOCADO", "BANANA", "BARLEY", "BASIL", "BAYLEAF", "GREENBEAN", "LIMABEAN", "WHITEBEAN", "BEEFIGE", "REDBEET", "BLUEBERRY", "BROCCOLI", "CABBAGE", "MELON", "CARROT", "CASEIN", "CASHEWNUT", "CAULIFLOWER", "CELERY"     Note: Lab results reviewed.  PFSH  Drug: Todd Wagner  reports current drug use. Drug: Marijuana. Alcohol:  reports current alcohol use. Tobacco:  reports that he has never smoked. He has never used smokeless tobacco. Medical:  has a past medical history of Diabetes mellitus without complication (HCC) and Hypertension. Family: family history is not on file.  Past Surgical History:  Procedure Laterality Date   APPENDECTOMY     CORONARY ANGIOPLASTY WITH STENT PLACEMENT     LEFT HEART CATH AND CORONARY ANGIOGRAPHY N/A 05/23/2017   Procedure: LEFT HEART CATH AND CORONARY ANGIOGRAPHY;  Surgeon: Elder Negus, MD;  Location: [facility];  Service: Cardiovascular;  Laterality: N/A;   Active Ambulatory Problems    Diagnosis Date Noted   Chest pain, atypical 05/12/2017   Essential hypertension 05/12/2017   Insulin dependent diabetes mellitus 05/12/2017   Normocytic anemia 05/12/2017   Acute low back pain (Bilateral) with sciatica 03/21/2018   Anxiety 06/23/2021   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 12/29/2013   Atrial fibrillation with rapid ventricular response (HCC) 10/28/2013   Behavioral change 04/08/2020   Bunion of foot (Right) 04/26/2016   Toe blister  without infection 03/07/2023   Chronic shoulder pain (Left) 04/08/2017   Chronic pain syndrome 11/02/2017   Constipation 12/17/2019   Controlled type 2 diabetes mellitus with diabetic nephropathy, with long-term current use of insulin (HCC) 10/28/2013   Coronary artery disease 03/26/2019  Diabetic nephropathy associated with type 2 diabetes mellitus (HCC) 10/07/2014   Financial difficulty 03/26/2019   Neck pain 09/21/2022   Headache 09/02/2019   Healthcare maintenance 04/11/2017   S/P ablation of atrial flutter 07/10/2017   Homicidal ideation 04/08/2017   Hyperlipidemia 10/07/2014   Iliotibial band syndrome (Left) 10/30/2022   Insomnia 01/12/2023   Left upper quadrant abdominal pain 01/18/2020   Medical non-compliance 03/07/2023   Mild cognitive impairment with memory loss 09/02/2019   Mild nonproliferative diabetic retinopathy of both eyes without macular edema associated with type 2 diabetes mellitus (HCC) 05/14/2019   Onychomycosis 06/08/2018   Osteoarthritis of lumbar spine 01/12/2023   Chronic wrist pain (1ry area of Pain) (Bilateral) 12/17/2019   Myalgia 09/21/2022   Pain in joint 10/28/2013   Plantar fascial fibromatosis of both feet 08/19/2019   Polyneuropathy associated with underlying disease (HCC) 10/07/2014   Polysubstance abuse (HCC) 06/23/2021   Risk for falls 10/30/2022   Short-term memory loss 06/01/2019   Subacute osteomyelitis of foot (Right) (HCC) 05/10/2021   Toenail deformity 03/07/2023   Hypertension complicating diabetes (HCC) 11/02/2017   Gastroparesis due to secondary diabetes (HCC) 05/13/2019   Hyperglycemia 03/07/2023   Diabetes mellitus (HCC) 10/30/2022   Anemia 05/12/2017   Pharmacologic therapy 03/07/2023   Disorder of skeletal system 03/07/2023   Problems influencing health status 03/07/2023   Gastroesophageal reflux disease 02/04/2023   Neuroforaminal stenosis of lumbar spine 02/04/2023   Spinal stenosis of lumbar region 02/04/2023   Old  tear of medial meniscus of left knee 02/04/2023   Osteoarthritis of hands (Bilateral) 02/04/2023   Chronic hand pain (1ry area of Pain) (Bilateral) 03/11/2023   Osteoarthritis of wrists (Bilateral) 03/11/2023   Low back pain of over 3 months duration 03/11/2023   Chronic knee pain 3 (3ry area of Pain) (Left) 03/11/2023   History of alcohol abuse 03/11/2023   History of marijuana use 03/11/2023   Chronic low back pain (2ry area of Pain) (Midline) w/o sciatica 03/11/2023   Lumbar facet joint pain 03/11/2023   Chronic feet pain (5th area of Pain) (Bilateral) (R=L) 03/11/2023   Chronic lower extremity pain (4th area of Pain) (Bilateral) (R=L) 03/11/2023   Abnormal MRI, lumbar spine (01/26/2023) 03/11/2023   Resolved Ambulatory Problems    Diagnosis Date Noted   No Resolved Ambulatory Problems   Past Medical History:  Diagnosis Date   Diabetes mellitus without complication (HCC)    Hypertension    Constitutional Exam  General appearance: Well nourished, well developed, and well hydrated. In no apparent acute distress Vitals:   03/11/23 0959  BP: 127/67  Pulse: 70  Temp: 98 F (36.7 C)  TempSrc: Temporal  SpO2: 100%  Weight: 200 lb (90.7 kg)  Height: 6' (1.829 m)   BMI Assessment: Estimated body mass index is 27.12 kg/m as calculated from the following:   Height as of this encounter: 6' (1.829 m).   Weight as of this encounter: 200 lb (90.7 kg).  BMI interpretation table: BMI level Category Range association with higher incidence of chronic pain  <18 kg/m2 Underweight   18.5-24.9 kg/m2 Ideal body weight   25-29.9 kg/m2 Overweight Increased incidence by 20%  30-34.9 kg/m2 Obese (Class I) Increased incidence by 68%  35-39.9 kg/m2 Severe obesity (Class II) Increased incidence by 136%  >40 kg/m2 Extreme obesity (Class III) Increased incidence by 254%   Patient's current BMI Ideal Body weight  Body mass index is 27.12 kg/m. Ideal body weight: 77.6 kg (171 lb 1.2 oz) Adjusted  ideal body weight: 82.8 kg (182 lb 10.3 oz)   BMI Readings from Last 4 Encounters:  03/11/23 27.12 kg/m  06/16/17 27.44 kg/m  05/22/17 23.06 kg/m  05/13/17 22.42 kg/m   Wt Readings from Last 4 Encounters:  03/11/23 200 lb (90.7 kg)  06/16/17 170 lb (77.1 kg)  05/22/17 170 lb (77.1 kg)  05/13/17 165 lb 5.5 oz (75 kg)    Psych/Mental status: Alert, oriented x 3 (person, place, & time)       Eyes: PERLA Respiratory: No evidence of acute respiratory distress  Assessment  Primary Diagnosis & Pertinent Problem List: The primary encounter diagnosis was Chronic pain syndrome. Diagnoses of Chronic wrist pain (Bilateral), Osteoarthritis of wrists (Bilateral), Chronic hand pain (Bilateral), Osteoarthritis of hands (Bilateral), Chronic low back pain (2ry area of Pain) (Midline) w/o sciatica, Low back pain of over 3 months duration, Other osteoarthritis of spine, lumbar region, Lumbar facet joint pain, Chronic knee pain (Left), Chronic lower extremity pain (4th area of Pain) (Bilateral) (R=L), Chronic feet pain (5th area of Pain) (Bilateral) (R=L), Pharmacologic therapy, History of marijuana use, History of alcohol abuse, Disorder of skeletal system, Problems influencing health status, and Abnormal MRI, lumbar spine were also pertinent to this visit.  Visit Diagnosis (New problems to examiner): 1. Chronic pain syndrome   2. Chronic wrist pain (Bilateral)   3. Osteoarthritis of wrists (Bilateral)   4. Chronic hand pain (Bilateral)   5. Osteoarthritis of hands (Bilateral)   6. Chronic low back pain (2ry area of Pain) (Midline) w/o sciatica   7. Low back pain of over 3 months duration   8. Other osteoarthritis of spine, lumbar region   9. Lumbar facet joint pain   10. Chronic knee pain (Left)   11. Chronic lower extremity pain (4th area of Pain) (Bilateral) (R=L)   12. Chronic feet pain (5th area of Pain) (Bilateral) (R=L)   13. Pharmacologic therapy   14. History of marijuana use   15.  History of alcohol abuse   16. Disorder of skeletal system   17. Problems influencing health status   18. Abnormal MRI, lumbar spine    Plan of Care (Initial workup plan)  Note: Todd Wagner was reminded that as per protocol, today's visit has been an evaluation only. We have not taken over the patient's controlled substance management.  Problem-specific plan: No problem-specific Assessment & Plan notes found for this encounter.  Lab Orders         Compliance Drug Analysis, Ur         Comp. Metabolic Panel (12)         Magnesium         Vitamin B12         Sedimentation rate         25-Hydroxy vitamin D Lcms D2+D3         C-reactive protein     Imaging Orders         DG Hand Complete Left         DG Hand Complete Right         DG Lumbar Spine Complete W/Bend     Referral Orders  No referral(s) requested today   Procedure Orders    No procedure(s) ordered today   Pharmacotherapy (current): Medications ordered:  No orders of the defined types were placed in this encounter.  Medications administered during this visit: Todd Wagner had no medications administered during this visit.   Analgesic Pharmacotherapy:  Opioid Analgesics: For patients currently  taking or requesting to take opioid analgesics, in accordance with [facility] [place] Guidelines, we will assess their risks and indications for the use of these substances. After completing our evaluation, we may offer recommendations, but we no longer take patients for medication management. The prescribing physician will ultimately decide, based on his/her training and level of comfort whether to adopt any of the recommendations, including whether or not to prescribe such medicines.  Membrane stabilizer: To be determined at a later time  Muscle relaxant: To be determined at a later time  NSAID: To be determined at a later time  Other analgesic(s): To be determined at a later time   Interventional management  options: Todd Wagner was informed that there is no guarantee that he would be a candidate for interventional therapies. The decision will be based on the results of diagnostic studies, as well as Todd Wagner risk profile.  Procedure(s) under consideration:  Pending results of ordered studies      Interventional Therapies  Risk Factors  Considerations:     Planned  Pending:      Under consideration:   Pending   Completed:   None at this time   Therapeutic  Palliative (PRN) options:   None established   Completed by other providers:   None reported      Provider-requested follow-up: Return in about 2 weeks (around 03/25/2023) for ( ), Eval-day (M,W), (F2F), 2nd Visit, for review of ordered tests.  No future appointments.   Duration of encounter: 42 minutes.  Total time on encounter, as per AMA guidelines included both the face-to-face and non-face-to-face time personally spent by the physician and/or other qualified health care professional(s) on the day of the encounter (includes time in activities that require the physician or other qualified health care professional and does not include time in activities normally performed by clinical staff). Physician's time may include the following activities when performed: Preparing to see the patient (e.g., pre-charting review of records, searching for previously ordered imaging, lab work, and nerve conduction tests) Review of prior analgesic pharmacotherapies. Reviewing PMP Interpreting ordered tests (e.g., lab work, imaging, nerve conduction tests) Performing post-procedure evaluations, including interpretation of diagnostic procedures Obtaining and/or reviewing separately obtained history Performing a medically appropriate examination and/or evaluation Counseling and educating the patient/family/caregiver Ordering medications, tests, or procedures Referring and communicating with other health care professionals (when not  separately reported) Documenting clinical information in the electronic or other health record Independently interpreting results (not separately reported) and communicating results to the patient/ family/caregiver Care coordination (not separately reported)  Note by: Oswaldo Done, MD (TTS technology used. I apologize for any typographical errors that were not detected and corrected.) Date: 03/11/2023; Time: 12:35 PM

## 2023-03-11 ENCOUNTER — Ambulatory Visit
Admission: RE | Admit: 2023-03-11 | Discharge: 2023-03-11 | Disposition: A | Discharge status: Home / Self Care | Payer: Medicaid Other | Source: Ambulatory Visit | Attending: Pain Medicine | Admitting: Pain Medicine

## 2023-03-11 ENCOUNTER — Encounter: Payer: Self-pay | Admitting: Pain Medicine

## 2023-03-11 ENCOUNTER — Ambulatory Visit: Payer: Medicaid Other | Admitting: Pain Medicine

## 2023-03-11 VITALS — BP 127/67 | HR 70 | Temp 98.0°F | Ht 72.0 in | Wt 200.0 lb

## 2023-03-11 DIAGNOSIS — F1291 Cannabis use, unspecified, in remission: Secondary | ICD-10-CM

## 2023-03-11 DIAGNOSIS — M5459 Other low back pain: Secondary | ICD-10-CM | POA: Insufficient documentation

## 2023-03-11 DIAGNOSIS — M79605 Pain in left leg: Secondary | ICD-10-CM | POA: Insufficient documentation

## 2023-03-11 DIAGNOSIS — M19032 Primary osteoarthritis, left wrist: Secondary | ICD-10-CM

## 2023-03-11 DIAGNOSIS — M19031 Primary osteoarthritis, right wrist: Secondary | ICD-10-CM

## 2023-03-11 DIAGNOSIS — M19042 Primary osteoarthritis, left hand: Secondary | ICD-10-CM

## 2023-03-11 DIAGNOSIS — M19041 Primary osteoarthritis, right hand: Secondary | ICD-10-CM | POA: Insufficient documentation

## 2023-03-11 DIAGNOSIS — Z79899 Other long term (current) drug therapy: Secondary | ICD-10-CM | POA: Insufficient documentation

## 2023-03-11 DIAGNOSIS — M79604 Pain in right leg: Secondary | ICD-10-CM | POA: Insufficient documentation

## 2023-03-11 DIAGNOSIS — M47896 Other spondylosis, lumbar region: Secondary | ICD-10-CM

## 2023-03-11 DIAGNOSIS — M545 Low back pain, unspecified: Secondary | ICD-10-CM

## 2023-03-11 DIAGNOSIS — M25562 Pain in left knee: Secondary | ICD-10-CM | POA: Insufficient documentation

## 2023-03-11 DIAGNOSIS — G894 Chronic pain syndrome: Secondary | ICD-10-CM | POA: Insufficient documentation

## 2023-03-11 DIAGNOSIS — M79671 Pain in right foot: Secondary | ICD-10-CM

## 2023-03-11 DIAGNOSIS — M79642 Pain in left hand: Secondary | ICD-10-CM | POA: Insufficient documentation

## 2023-03-11 DIAGNOSIS — M79641 Pain in right hand: Secondary | ICD-10-CM

## 2023-03-11 DIAGNOSIS — F1011 Alcohol abuse, in remission: Secondary | ICD-10-CM | POA: Insufficient documentation

## 2023-03-11 DIAGNOSIS — G8929 Other chronic pain: Secondary | ICD-10-CM | POA: Insufficient documentation

## 2023-03-11 DIAGNOSIS — Z789 Other specified health status: Secondary | ICD-10-CM

## 2023-03-11 DIAGNOSIS — R937 Abnormal findings on diagnostic imaging of other parts of musculoskeletal system: Secondary | ICD-10-CM | POA: Insufficient documentation

## 2023-03-11 DIAGNOSIS — M899 Disorder of bone, unspecified: Secondary | ICD-10-CM

## 2023-03-11 DIAGNOSIS — M79672 Pain in left foot: Secondary | ICD-10-CM

## 2023-03-11 DIAGNOSIS — M25532 Pain in left wrist: Secondary | ICD-10-CM | POA: Insufficient documentation

## 2023-03-11 DIAGNOSIS — M25531 Pain in right wrist: Secondary | ICD-10-CM | POA: Insufficient documentation

## 2023-03-12 LAB — COMP. METABOLIC PANEL (12): Total Protein: 6.9 g/dL (ref 6.0–8.5)

## 2023-03-12 LAB — 25-HYDROXY VITAMIN D LCMS D2+D3

## 2023-03-12 LAB — C-REACTIVE PROTEIN: CRP: 2 mg/L (ref 0–10)

## 2023-03-12 LAB — SEDIMENTATION RATE: Sed Rate: 16 mm/hr (ref 0–30)

## 2023-03-13 LAB — COMP. METABOLIC PANEL (12)
BUN/Creatinine Ratio: 14 (ref 10–24)
Sodium: 133 mmol/L — ABNORMAL LOW (ref 134–144)
eGFR: 50 mL/min/{1.73_m2} — ABNORMAL LOW (ref >59)

## 2023-03-13 LAB — VITAMIN B12: Vitamin B-12: 545 pg/mL (ref 232–1245)

## 2023-03-14 LAB — COMPLIANCE DRUG ANALYSIS, UR

## 2023-03-18 LAB — 25-HYDROXY VITAMIN D LCMS D2+D3
25-Hydroxy, Vitamin D-3: 19 ng/mL
25-Hydroxy, Vitamin D: 27 ng/mL — ABNORMAL LOW

## 2023-03-18 LAB — COMP. METABOLIC PANEL (12)
AST: 14 IU/L (ref 0–40)
Albumin: 4.5 g/dL (ref 3.8–4.9)
Alkaline Phosphatase: 144 IU/L — ABNORMAL HIGH (ref 44–121)
BUN: 22 mg/dL (ref 8–27)
Bilirubin Total: 0.3 mg/dL (ref 0.0–1.2)
Calcium: 9.3 mg/dL (ref 8.6–10.2)
Chloride: 101 mmol/L (ref 96–106)
Creatinine, Ser: 1.58 mg/dL — ABNORMAL HIGH (ref 0.76–1.27)
Globulin, Total: 2.4 g/dL (ref 1.5–4.5)
Glucose: 295 mg/dL — ABNORMAL HIGH (ref 70–99)

## 2023-03-18 LAB — MAGNESIUM: Magnesium: 1.7 mg/dL (ref 1.6–2.3)

## 2023-04-13 DIAGNOSIS — G5603 Carpal tunnel syndrome, bilateral upper limbs: Secondary | ICD-10-CM | POA: Insufficient documentation

## 2023-04-13 DIAGNOSIS — G5622 Lesion of ulnar nerve, left upper limb: Secondary | ICD-10-CM | POA: Insufficient documentation

## 2023-04-15 ENCOUNTER — Ambulatory Visit: Payer: Medicaid Other | Admitting: Pain Medicine

## 2023-04-16 NOTE — Progress Notes (Unsigned)
PROVIDER NOTE: Information contained herein reflects review and annotations entered in association with encounter. Interpretation of such information and data should be left to medically-trained personnel. Information provided to patient can be located elsewhere in the medical record under "Patient Instructions". Document created using STT-dictation technology, any transcriptional errors that may result from process are unintentional.    Patient: Todd Wagner  Service Category: E/M  Provider: Oswaldo Done, MD  DOB: Apr 04, 1961  DOS: 04/17/2023  Referring Provider: [facility]  MRN: [id number]  Specialty: Interventional Pain Management  PCP: [facility]  Type: Established Patient  Setting: Ambulatory outpatient    Location: Office  Delivery: Face-to-face     Primary Reason(s) for Visit: Encounter for evaluation before starting new chronic pain management plan of care (Level of risk: moderate) CC: Wrist Pain (bilat), Hand Pain (bilat), and Foot Pain (bilat)  HPI  Mr. Henault is a 62 y.o. year old, male patient, who comes today for a follow-up evaluation to review the test results and decide on a treatment plan. 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; Atrial fibrillation with rapid ventricular response (HCC); Behavioral change; Bunion of foot (Right); Toe blister without infection; Chronic shoulder pain (Left); Chronic pain syndrome; Constipation; Controlled type 2 diabetes mellitus with diabetic nephropathy, with long-term current use of insulin (HCC); Coronary artery disease; Diabetic nephropathy associated with type 2 diabetes mellitus (HCC); Financial difficulty; Neck pain; Headache; Healthcare maintenance; S/P ablation of atrial flutter; Homicidal ideation;  Hyperlipidemia; Iliotibial band syndrome (Left); Insomnia; Left upper quadrant abdominal pain; Medical non-compliance; Mild cognitive impairment with memory loss; Mild nonproliferative diabetic retinopathy of both eyes without macular edema associated with type 2 diabetes mellitus (HCC); Onychomycosis; Osteoarthritis of lumbar spine; Chronic wrist pain (1ry area of Pain) (Bilateral); Myalgia; Pain in joint; Plantar fascial fibromatosis of feet (Bilateral); Polyneuropathy associated with underlying disease (HCC); Polysubstance abuse (HCC); Risk for falls; Short-term memory loss; Subacute osteomyelitis of foot (Right) (HCC); Toenail deformity; Hypertension complicating diabetes (HCC); Gastroparesis due to secondary diabetes (HCC); Hyperglycemia; Diabetes mellitus (HCC); Anemia; Pharmacologic therapy; Disorder of skeletal system; Problems influencing health status; Gastroesophageal reflux disease; 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, L4-5, L5-S1) (Severe: L4-5); Lumbar central spinal stenosis w/o claudication; Old tear of medial meniscus of knee (Left); Osteoarthritis of hands (Bilateral); Chronic hand pain (1ry area of Pain) (Bilateral); Osteoarthritis of wrists (Bilateral); Low back pain of over 3 months duration; Chronic knee pain (3ry area of Pain) (Left); History of alcohol abuse; History of marijuana use; Chronic low back pain (2ry area of Pain) (Midline) w/o sciatica; Lumbar facet joint pain; Chronic feet pain (5th area of Pain) (Bilateral) (R=L); Chronic lower extremity pain (4th area of Pain) (Bilateral) (R=L); Abnormal MRI, lumbar spine (01/26/2023); Carpal tunnel syndrome (Bilateral) (L>R); Ulnar neuropathy of upper extremity (Left); Generalized polyneuropathy; Hypertrophy of ligamentum flavum (L2-3, L3-4, L4-5); Abnormal NCS (UE)(nerve conduction studies) (03/11/2023); Traumatic osteoarthritis of wrist (Left); Arthropathy of wrist (Left); Chronic painful diabetic neuropathy (HCC); Neuropathy, 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 His primarily concern today is the Wrist Pain (bilat), Hand Pain (bilat), and Foot Pain (bilat)  Pain Assessment: Location: Right, Left Wrist Radiating: to hands, fingers bilat Onset: More than a month ago Duration: Chronic pain Quality: Constant, Sharp Severity: 10-Worst pain ever/10 (subjective, self-reported pain score)  Effect on ADL: limits daily activities; "I  can't do anything" Timing: Constant Modifying factors: denies BP: 116/68  HR: 77  Mr. Wiltgen comes in today for a follow-up visit after his initial evaluation on 03/11/2023. Today we went over the results of his tests. These were explained in "Layman's terms". During today's appointment we went over my diagnostic impression, as well as the proposed treatment plan.  Review of initial evaluation (03/11/2023): "According to the patient the primary area of pain is that of the hands and wrists (Bilateral) (R>L).  The patient indicates being right-handed.  He states having numbness in all of his fingers on both hands.  He stated that he was told that he would be referred for an EMG/PNCV of the upper extremities looking for the possibility of a peripheral neuropathy.  He describes this hand and wrist pain to have developed slowly over time and it has been present for the last 2 to 3 years.  Before then he indicates that the pain used to be more intermittent.  He denies any surgeries of the hands, physical therapy, or any nerve blocks.  He also denies any recent x-rays or nerve conduction test.   The patient's secondary area pain is out of the lower back (Midline).  He denies any surgery, physical therapy, or nerve blocks.  He describes having had an MRI approximately 2 months ago.   The patient's third area pain is out of the left knee.  He describes this to be secondary to some football injuries.  He denies any prior surgeries, injections, but he does admit  to physical therapy and an MRI.   The patient's fourth area pain is that of the lower extremities (Bilateral) (R=L).  He describes the pain to be over the anterior shin area, bilaterally, all the way down to the dorsum of his feet.  He denies the pain going into his toes.  The patient also denies any prior surgeries, nerve blocks, x-rays, nerve conduction test, or physical therapy for this pain.   The patient's fifth area pain is that of his feet (Bilateral) (R>L).  He describes this as a burning sensation over the top of his feet.   Physical exam: Today the patient was able to toe walk and heel walk without any major problems.  He does seem to have some degree of deconditioning.  Lumbar hyperextension as well as hyperextension on rotation maneuver did trigger the low back pain bilaterally.  Straight leg raise was negative.  Patrick maneuver was also negative bilaterally for hip joint or sacroiliac joint arthralgias."  Review of ordered test on 03/11/2023: Lab work demonstrates the patient to have a vitamin D insufficiency.  Comprehensive metabolic panel shows elevated glucose levels at 295 mg/dL with elevated creatinine, decreased eGFR, decreased sodium levels, and elevated alkaline phosphatase.  The rest of the labs were within normal limits.  Diagnostic x-rays of the left hand show no acute findings.  Stable arthropathic changes at the radial scaphoid articulation, which may reflect posttraumatic osteoarthritis.  Diagnostic x-rays of the right hand show no acute findings.  Degenerative/arthropathic changes at the radial scaphoid articulation similar to prior x-rays.  Moderate osteoarthritis involving the fourth finger DIP joint.  Diagnostic x-rays of the lumbar spine showed a grade 1 anterolisthesis of L4 over L5.  Mild facet degenerative changes at L4-5.  In addition, on 03/11/2023 the patient had bilateral upper extremity nerve conduction tests (EMG/PNCV): Conclusion: Abnormal study. Electrodiagnostic  findings show evidence of bilateral,  (L>R), severe median neuropathies at the wrists. There is also evidence  of a left ulnar neuropathy at the elbow. Electrodiagnostic findings in the arms are suggestive of a generalized polyneuropathy, as can be seen with diabetes. By Elisabeth Most, MD [facility]).  As seen below on the illustrations, the combination of the median and ulnar neuropathies can result in pain and/or numbness affecting the entire hand.     Diabetic peripheral neuropathy of the upper and lower extremities usually will have a pain pattern as seen below:    Today I went over all of the above findings which I have explained to the patient in layman's terms.  I have also printed some images from the Internet to explain the anterolisthesis and the facet degeneration as well asked images of the wrists and hands to also explain to him for some of these problems seem to be coming from.  I went over the results of his nerve conduction test and I have explained all of those findings to him and what they mean.  Above all, I have attempted to convey the fact that many of these are chronic conditions that will not go away and that all we can do is try to get him under control with some of these therapies.  I have also explained to him that should thesis therapy is not help with the pain, we may need to consider referrals to other surgical specialists to evaluate the condition and determine whether or not there is any type of surgically correctable lesion.  He was provided with a written copy of all of the x-ray reports where I have highlighted the primary issues that could cause pain.  Because we are dealing with multiple problems, I have given the patient the option of determining where to start.  After careful consideration he has indicated that he would like to have his feet pain treated first followed by that of his hands and upper extremities.  After that, then we can work on his  lower back.  I made it clear to him that we can only work on 1 area at a time.  Today he indicated that he recently had some injections done into his knees and apparently he had 1 injection done by 1 provider and then went on to have a different injection by another provider.  I have warned him that this practice would lead to failure and that treatment of those conditions since he needs to work with 1 provider and stick with that 1 provider so that they can monitor what is being administer, what the results are, and if ineffective then decide what the next step would be.  Patient presented with interventional treatment options. Mr. Fulton was informed that I will not be providing medication management. Pharmacotherapy evaluation including recommendations may be offered, if specifically requested.   Controlled Substance Pharmacotherapy Assessment REMS (Risk Evaluation and Mitigation Strategy)  Opioid Analgesic: No chronic opioid analgesics therapy prescribed by our practice. None MME/day: 0 mg/day  Pill Count: None expected due to no prior prescriptions written by our practice. Nonah Mattes, RN  04/17/2023  3:14 PM  Sign when Signing Visit Safety precautions to be maintained throughout the outpatient stay will include: orient to surroundings, keep bed in low position, maintain call bell within reach at all times, provide assistance with transfer out of bed and ambulation.    Pharmacokinetics: Liberation and absorption (onset of action): WNL Distribution (time to peak effect): WNL Metabolism and excretion (duration of action): WNL         Pharmacodynamics: Desired  effects: Analgesia: Mr. Tenold reports >50% benefit. Functional ability: Patient reports that medication allows him to accomplish basic ADLs Clinically meaningful improvement in function (CMIF): Sustained CMIF goals met Perceived effectiveness: Described as relatively effective, allowing for increase in activities of daily living  (ADL) Undesirable effects: Side-effects or Adverse reactions: None reported Monitoring: [place] PMP: PDMP reviewed during this encounter. Online review of the past 46-month period previously conducted. Not applicable at this point since we have not taken over the patient's medication management yet. List of other Serum/Urine Drug Screening Test(s):  Lab Results  Component Value Date   ETH 38 (H) 06/16/2017   List of all UDS test(s) done:  Lab Results  Component Value Date   SUMMARY Note 03/11/2023   Last UDS on record: Summary  Date Value Ref Range Status  03/11/2023 Note  Final    Comment:    ==================================================================== Compliance Drug Analysis, Ur ==================================================================== Test                             Result       Flag       Units  Drug Present and Declared for Prescription Verification   Gabapentin                     PRESENT      EXPECTED   Amitriptyline                  PRESENT      EXPECTED   Mirtazapine                    PRESENT      EXPECTED   Trazodone                      PRESENT      EXPECTED   1,3 chlorophenyl piperazine    PRESENT      EXPECTED    1,3-chlorophenyl piperazine is an expected metabolite of trazodone.    Metoprolol                     PRESENT      EXPECTED  Drug Absent but Declared for Prescription Verification   Hydrocodone                    Not Detected UNEXPECTED ng/mg creat   Acetaminophen                  Not Detected UNEXPECTED    Acetaminophen, as indicated in the declared medication list, is not    always detected even when used as directed.    Salicylate                     Not Detected UNEXPECTED    Aspirin, as indicated in the declared medication list, is not always    detected even when used as directed.    Diphenhydramine                Not Detected UNEXPECTED ==================================================================== Test                       Result    Flag   Units      Ref Range   Creatinine              67  mg/dL      >=40 ==================================================================== Declared Medications:  The flagging and interpretation on this report are based on the  following declared medications.  Unexpected results may arise from  inaccuracies in the declared medications.   **Note: The testing scope of this panel includes these medications:   Amitriptyline (Elavil)  Diphenhydramine (Sominex)  Gabapentin (Neurontin)  Hydrocodone (Norco)  Metoprolol (Lopressor)  Mirtazapine (Remeron)  Trazodone (Desyrel)   **Note: The testing scope of this panel does not include small to  moderate amounts of these reported medications:   Acetaminophen (Tylenol)  Acetaminophen (Norco)  Aspirin   **Note: The testing scope of this panel does not include the  following reported medications:   Albuterol (Ventolin HFA)  Atorvastatin (Lipitor)  Colchicine  Glipizide (Glucotrol)  Insulin (NovoLog)  Lisinopril (Zestril)  Melatonin  Meloxicam (Mobic)  Metformin (Glucophage)  Nitroglycerin (Nitrostat)  Omeprazole (Prilosec)  Ondansetron (Zofran) ==================================================================== For clinical consultation, please call [phone]. ====================================================================    UDS interpretation: No unexpected findings.          Medication Assessment Form: Not applicable. No opioids. Treatment compliance: Not applicable Risk Assessment Profile: Aberrant behavior: See initial evaluations. None observed or detected today Comorbid factors increasing risk of overdose: See initial evaluation. No additional risks detected today Opioid risk tool (ORT):     03/11/2023   10:18 AM  Opioid Risk   Alcohol 0  Illegal Drugs 0  Rx Drugs 0  Alcohol 3  Illegal Drugs 4  Rx Drugs 0  Age between 16-45 years  0  Psychological Disease 0  Depression 0   Opioid Risk Tool Scoring 7  Opioid Risk Interpretation Moderate Risk    ORT Scoring interpretation table:  Score <3 = Low Risk for SUD  Score between 4-7 = Moderate Risk for SUD  Score >8 = High Risk for Opioid Abuse   Risk of substance use disorder (SUD): Low  Risk Mitigation Strategies:  Patient opioid safety counseling: No controlled substances prescribed. Patient-Prescriber Agreement (PPA): No agreement signed.  Controlled substance notification to other providers: None required. No opioid therapy.  Pharmacologic Plan: Non-opioid analgesic therapy offered. Interventional alternatives discussed.             Laboratory Chemistry Profile   Renal Lab Results  Component Value Date   BUN 22 03/11/2023   CREATININE 1.58 (H) 03/11/2023   BCR 14 03/11/2023   GFRAA >60 06/16/2017   GFRNONAA >60 06/16/2017     Electrolytes Lab Results  Component Value Date   NA 133 (L) 03/11/2023   K CANCELED 03/11/2023   CL 101 03/11/2023   CALCIUM 9.3 03/11/2023   MG 1.7 03/11/2023     Hepatic Lab Results  Component Value Date   AST 14 03/11/2023   ALT 29 06/16/2017   ALBUMIN 4.5 03/11/2023   ALKPHOS 144 (H) 03/11/2023   LIPASE 18 06/16/2017     ID Lab Results  Component Value Date   HIV Non Reactive 05/23/2017   MRSAPCR NEGATIVE 05/12/2017     Bone Lab Results  Component Value Date   25OHVITD1 27 (L) 03/11/2023   25OHVITD2 7.5 03/11/2023   25OHVITD3 19 03/11/2023     Endocrine Lab Results  Component Value Date   GLUCOSE 295 (H) 03/11/2023   HGBA1C 7.2 (H) 05/23/2017     Neuropathy Lab Results  Component Value Date   VITAMINB12 545 03/11/2023   HGBA1C 7.2 (H) 05/23/2017   HIV Non Reactive 05/23/2017     CNS No results found  for: "COLORCSF", "APPEARCSF", "RBCCOUNTCSF", "WBCCSF", "POLYSCSF", "LYMPHSCSF", "EOSCSF", "PROTEINCSF", "GLUCCSF", "JCVIRUS", "CSFOLI", "IGGCSF", "LABACHR", "ACETBL"   Inflammation (CRP: Acute  ESR: Chronic) Lab Results  Component  Value Date   CRP 2 03/11/2023   ESRSEDRATE 16 03/11/2023     Rheumatology No results found for: "RF", "ANA", "LABURIC", "URICUR", "LYMEIGGIGMAB", "LYMEABIGMQN", "HLAB27"   Coagulation Lab Results  Component Value Date   INR 1.08 05/23/2017   LABPROT 13.9 05/23/2017   PLT 306 06/16/2017     Cardiovascular Lab Results  Component Value Date   TROPONINI <0.03 05/23/2017   HGB 11.9 (L) 06/16/2017   HCT 35.4 (L) 06/16/2017     Screening Lab Results  Component Value Date   MRSAPCR NEGATIVE 05/12/2017   HIV Non Reactive 05/23/2017     Cancer No results found for: "CEA", "CA125", "LABCA2"   Allergens No results found for: "ALMOND", "APPLE", "ASPARAGUS", "AVOCADO", "BANANA", "BARLEY", "BASIL", "BAYLEAF", "GREENBEAN", "LIMABEAN", "WHITEBEAN", "BEEFIGE", "REDBEET", "BLUEBERRY", "BROCCOLI", "CABBAGE", "MELON", "CARROT", "CASEIN", "CASHEWNUT", "CAULIFLOWER", "CELERY"     Note: Lab results reviewed.  Recent Diagnostic Imaging Review  Lumbosacral Imaging: Lumbar DG Bending views: Results for orders placed during the hospital encounter of 03/11/23 DG Lumbar Spine Complete W/Bend  Narrative CLINICAL DATA:  Low back pain for 3 months without sciatica.  EXAM: LUMBAR SPINE - COMPLETE WITH BENDING VIEWS  COMPARISON:  CT abdomen and pelvis, 7252  FINDINGS: No fracture.  No bone lesion.  Grade 1 anterolisthesis of L4 on L5. Mild loss of disc height at L4-L5. No other malalignment. Remaining disc spaces are well preserved. Mild facet degenerative change at L4-L5.  Generalized diffuse increase in bowel gas without bowel dilation.  IMPRESSION: 1. No fracture or acute finding. 2. Degenerative changes at L4-L5 with a grade 1 anterolisthesis. Degenerative changes are similar to the prior CT. Anterolisthesis was not present on the prior CT.   Electronically Signed By: Amie Portland M.D. On: 03/15/2023 14:29  Hand Imaging: Hand-R DG Complete: Results for orders placed during the  hospital encounter of 03/11/23 DG Hand Complete Right  Narrative CLINICAL DATA:  Chronic bilateral hand pain.  EXAM: RIGHT HAND - COMPLETE 3+ VIEW  COMPARISON:  Right wrist radiographs, 07/28/2013.  FINDINGS: No fracture.  No bone lesion.  Joint space narrowing, mild subchondral sclerosis and cystic change and marginal osteophytes noted at fourth finger DIP joint.  Joint space narrowing with mild subchondral sclerosis and small marginal osteophytes noted at the radial scaphoid articulation. Remaining joints normally spaced and aligned  Small cysts noted, base of the second metacarpal and in the distal triquetrum.  Soft tissues are unremarkable.  IMPRESSION: 1. No fracture or acute finding. 2. Degenerative/arthropathic changes at the radial scaphoid articulation similar to the prior wrist radiographs. 3. Moderate osteoarthritis involving the fourth finger DIP joint.   Electronically Signed By: Amie Portland M.D. On: 03/15/2023 14:26  Hand-L DG Complete: Results for orders placed during the hospital encounter of 03/11/23 DG Hand Complete Left  Narrative CLINICAL DATA:  Chronic bilateral hand pain.  EXAM: LEFT HAND - COMPLETE 3+ VIEW  COMPARISON:  Left wrist radiographs, 07/24/2019.  FINDINGS: No fracture or acute finding.  No bone lesion.  Marked narrowing of the radial scaphoid joint with subchondral cystic change mild sclerosis and a bony prominence from the radial margin the scaphoid stable compared to the prior exam.  Remaining joints are normally spaced and aligned.  Soft tissues are unremarkable.  IMPRESSION: 1. No fracture or acute finding. 2. Stable arthropathic changes at the radial scaphoid  articulation, which may reflect posttraumatic osteoarthritis. 3. No other arthropathic changes.   Electronically Signed By: Amie Portland M.D. On: 03/15/2023 14:24    Complexity Note: Imaging results reviewed.                         Meds    Current Outpatient Medications:    acetaminophen (TYLENOL) 500 MG tablet, Take 500 mg by mouth every 6 (six) hours as needed for mild pain., Disp: , Rfl:    amitriptyline (ELAVIL) 50 MG tablet, Take 100 mg by mouth at bedtime as needed for sleep. , Disp: , Rfl:    aspirin 81 MG tablet, Take 81 mg by mouth daily., Disp: , Rfl:    atorvastatin (LIPITOR) 40 MG tablet, Take 40 mg by mouth daily., Disp: , Rfl:    Blood Glucose Monitoring Suppl (ACCU-CHEK GUIDE ME) w/Device KIT, See admin instructions., Disp: , Rfl:    colchicine 0.6 MG tablet, Take 0.6 mg by mouth daily., Disp: , Rfl:    Continuous Glucose Sensor (DEXCOM G7 SENSOR) MISC, 1 Device by Miscellaneous route every ten (10) days., Disp: , Rfl:    diphenhydrAMINE (SOMINEX) 25 MG tablet, Take 25 mg by mouth at bedtime as needed for itching., Disp: , Rfl:    gabapentin (NEURONTIN) 300 MG capsule, Take 900 mg by mouth 3 (three) times daily., Disp: , Rfl:    glipiZIDE (GLUCOTROL) 5 MG tablet, Take 5 mg by mouth daily before breakfast., Disp: , Rfl:    HYDROcodone-acetaminophen (NORCO/VICODIN) 5-325 MG tablet, Take 1-2 tablets by mouth daily as needed for moderate pain., Disp: , Rfl:    insulin aspart protamine - aspart (NOVOLOG 70/30 MIX) (70-30) 100 UNIT/ML FlexPen, Inject 25 Units into the skin 2 (two) times daily with a meal., Disp: , Rfl:    LANTUS SOLOSTAR 100 UNIT/ML Solostar Pen, Inject 25 Units into the skin daily., Disp: , Rfl:    lisinopril (PRINIVIL,ZESTRIL) 5 MG tablet, Take 5 mg by mouth daily., Disp: , Rfl:    Melatonin 3 MG CAPS, Take 3 mg by mouth daily., Disp: , Rfl:    meloxicam (MOBIC) 7.5 MG tablet, Take 7.5 mg by mouth daily., Disp: , Rfl:    metFORMIN (GLUCOPHAGE) 1000 MG tablet, Take 1,000 mg by mouth 2 (two) times daily with a meal., Disp: , Rfl:    metoprolol tartrate (LOPRESSOR) 25 MG tablet, Take 25 mg by mouth 2 (two) times daily., Disp: , Rfl:    mirtazapine (REMERON) 45 MG tablet, Take 45 mg by mouth at bedtime.,  Disp: , Rfl:    nitroGLYCERIN (NITROSTAT) 0.4 MG SL tablet, Place 1 tablet (0.4 mg total) under the tongue every 5 (five) minutes x 3 doses as needed for chest pain., Disp: 30 tablet, Rfl: 1   omeprazole (PRILOSEC) 20 MG capsule, Take 20 mg by mouth daily., Disp: , Rfl:    ondansetron (ZOFRAN) 4 MG tablet, Take 1 tablet (4 mg total) by mouth every 6 (six) hours as needed for nausea or vomiting., Disp: 12 tablet, Rfl: 0   traZODone (DESYREL) 50 MG tablet, Take 50 mg by mouth at bedtime., Disp: , Rfl:    VENTOLIN HFA 108 (90 Base) MCG/ACT inhaler, Inhale 2 puffs into the lungs every 6 (six) hours as needed for wheezing., Disp: , Rfl:   ROS  Constitutional: Denies any fever or chills Gastrointestinal: No reported hemesis, hematochezia, vomiting, or acute GI distress Musculoskeletal: Denies any acute onset joint swelling, redness, loss  of ROM, or weakness Neurological: No reported episodes of acute onset apraxia, aphasia, dysarthria, agnosia, amnesia, paralysis, loss of coordination, or loss of consciousness  Allergies  Mr. Finucan has No Known Allergies.  PFSH  Drug: Mr. Reynolds  reports current drug use. Drug: Marijuana. Alcohol:  reports current alcohol use. Tobacco:  reports that he has never smoked. He has never used smokeless tobacco. Medical:  has a past medical history of Diabetes mellitus without complication (HCC) and Hypertension. Surgical: Mr. Brus  has a past surgical history that includes Appendectomy; Coronary angioplasty with stent; and LEFT HEART CATH AND CORONARY ANGIOGRAPHY (N/A, 05/23/2017). Family: family history is not on file.  Constitutional Exam  General appearance: Well nourished, well developed, and well hydrated. In no apparent acute distress Vitals:   04/17/23 1457  BP: 116/68  Pulse: 77  Resp: 18  Temp: 97.9 F (36.6 C)  TempSrc: Temporal  SpO2: 100%  Weight: 209 lb (94.8 kg)  Height: 6' (1.829 m)   BMI Assessment: Estimated body mass index is 28.35  kg/m as calculated from the following:   Height as of this encounter: 6' (1.829 m).   Weight as of this encounter: 209 lb (94.8 kg).  BMI interpretation table: BMI level Category Range association with higher incidence of chronic pain  <18 kg/m2 Underweight   18.5-24.9 kg/m2 Ideal body weight   25-29.9 kg/m2 Overweight Increased incidence by 20%  30-34.9 kg/m2 Obese (Class I) Increased incidence by 68%  35-39.9 kg/m2 Severe obesity (Class II) Increased incidence by 136%  >40 kg/m2 Extreme obesity (Class III) Increased incidence by 254%   Patient's current BMI Ideal Body weight  Body mass index is 28.35 kg/m. Ideal body weight: 77.6 kg (171 lb 1.2 oz) Adjusted ideal body weight: 84.5 kg (186 lb 3.9 oz)   BMI Readings from Last 4 Encounters:  04/17/23 28.35 kg/m  03/11/23 27.12 kg/m  06/16/17 27.44 kg/m  05/22/17 23.06 kg/m   Wt Readings from Last 4 Encounters:  04/17/23 209 lb (94.8 kg)  03/11/23 200 lb (90.7 kg)  06/16/17 170 lb (77.1 kg)  05/22/17 170 lb (77.1 kg)    Psych/Mental status: Alert, oriented x 3 (person, place, & time)       Eyes: PERLA Respiratory: No evidence of acute respiratory distress  Assessment & Plan  Primary Diagnosis & Pertinent Problem List: The primary encounter diagnosis was Chronic hand pain (1ry area of Pain) (Bilateral). 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  Visit Diagnosis: 1. Chronic hand pain (1ry area of Pain) (Bilateral)   2. Chronic wrist pain (1ry area of Pain) (Bilateral)   3. Carpal tunnel syndrome (Bilateral) (L>R)   4. Neuropathy, median nerve (Bilateral) (L>R) (Severe)   5. Arthropathy of wrist (Left)   6. Traumatic osteoarthritis of wrist (Left)  7. Chronic low back pain (2ry area of Pain) (Midline) w/o sciatica   8. Chronic knee pain (3ry area of Pain) (Left)   9. Chronic lower extremity pain (4th area of Pain) (Bilateral) (R=L)   10. Low back pain of over 3 months duration   11. 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 Lumbar central spinal stenosis w/o claudication   13. Foraminal stenosis of lumbar region   14. Chronic feet pain (5th area of Pain) (Bilateral) (R=L)   15. Chronic painful diabetic neuropathy (HCC)   16. Generalized polyneuropathy   17. Abnormal NCS (UE)(nerve conduction studies) (03/11/2023)   18. Abnormal MRI, lumbar spine (01/26/2023)   19. Ulnar neuropathy of upper extremity (Left)   20. 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 These electrodiagnostic findings show evidence of bilateral,  (L>R), severe median neuropathies at the wrists. There is also evidence of a left ulnar neuropathy at the elbow.  Electrodiagnostic findings in the arms are suggestive of a generalized polyneuropathy, as can be seen with diabetes. By Elisabeth Most, MD [facility])    Traumatic osteoarthritis of wrist (Left)   Stable arthropathic changes at the radial scaphoid articulation, which may reflect posttraumatic osteoarthritis.   Arthropathy of wrist (Left)   Stable arthropathic changes at the radial scaphoid articulation, which may reflect posttraumatic osteoarthritis.   Chronic Painful Diabetic Neuropathy (Hcc)  Neuropathy, median nerve (Bilateral) (L>R)   (03/11/2023) Patient had bilateral upper extremity nerve conduction tests (EMG/PNCV): Conclusion: Abnormal study. Electrodiagnostic findings show evidence of bilateral,  (L>R), severe median neuropathies at the wrists. There is also evidence of a left ulnar neuropathy at the elbow. Electrodiagnostic findings in the arms are suggestive of a generalized polyneuropathy, as can be seen with diabetes. By Elisabeth Most, MD [facility])    Cubital tunnel syndrome (Left)   (03/11/2023) Patient had bilateral upper extremity nerve conduction tests (EMG/PNCV): Conclusion: Abnormal study. Electrodiagnostic evidence of a left ulnar neuropathy at the elbow.  Ulnar neuropathy at the elbow, also known as cubital tunnel syndrome is the second most common type of nerve entrapment of the upper extremity.  Symptoms can include pain that radiates into the hand, shoulder or neck with numbness or tingling in the ring and little fingers.  Patient is may experience sensory changes to touch and some weakness of some of the muscles in the hand.   Carpal tunnel syndrome (Bilateral) (L>R)   (03/11/2023) Patient had bilateral upper extremity nerve conduction tests (EMG/PNCV): Conclusion: Abnormal study. Electrodiagnostic findings show evidence of bilateral,  (L>R), severe median neuropathies at the wrists. By Elisabeth Most, MD [facility])    Ulnar  neuropathy of upper extremity (Left)   (03/11/2023) Patient had bilateral upper extremity nerve conduction tests (EMG/PNCV): Conclusion: Abnormal study. Electrodiagnostic evidence of a left ulnar neuropathy at the elbow.   Abnormal MRI, lumbar spine (01/26/2023)   (01/26/2023) LUMBAR MRI FINDINGS: Trace multilevel listhesis is likely degenerative. Vertebral bodies are otherwise normally aligned. Mild disc desiccation and loss of disc height within the lower thoracic spine and at L4-5 with degenerative endplate changes. Multiple endplate Schmorl's nodes within the lower thoracic and upper lumbar spine.  Multilevel lumbar spondylosis with varying degrees of spinal and neural foraminal stenosis, as follows:  DISC LEVELS: L2-3: Disc bulge with ligament flavum thickening resulting mild canal narrowing.  L3-4: Disc bulge with ligament flavum thickening and facet arthrosis with mild canal narrowing. Mild bilateral neural foraminal narrowing. L4-5: Congenital canal narrowing likely due to its short pedicles with superimposed ligament flavum thickening, disc bulge and facet arthrosis resulting in overall mild to moderate canal narrowing. Severe bilateral neural foraminal narrowing. L5-S1: Congenital canal narrowing with short pedicles and epidural lipomatosis resulting in overall mild canal narrowing. Moderate bilateral neural foraminal narrowing.  For the purposes of this dictation, the lowest well formed intervertebral disc space is assumed to be the L5-S1 level, and there are presumed to be five lumbar-type vertebral bodies.  IMPRESSION: 1. 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 Most notably, there is severe bilateral neural foraminal narrowing at L4-5. 2. Multilevel loss of disc height with degenerative endplate changes from T11-L2.   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, L4-5, L5-S1) (Severe: L4-5)   (01/26/2023) LUMBAR MRI FINDINGS: LEVELS: L3-4: Mild  bilateral neural foraminal narrowing. L4-5: Severe bilateral neural foraminal narrowing. L5-S1: Moderate bilateral neural foraminal narrowing.   Lumbar central spinal stenosis w/o claudication   (01/26/2023) LUMBAR MRI FINDINGS: LEVELS: L2-3: mild canal narrowing.  L3-4: mild canal narrowing. L4-5: moderate canal narrowing. L5-S1: Congenital mild canal narrowing.   Old tear of medial meniscus of knee (Left)  Plantar fascial fibromatosis of feet (Bilateral)   Last Assessment & Plan: Formatting of this note might be different from the original. Patient continues to endorse pain in feet from this condition. He was evaluated by Dr. Nedra Hai and offered injections but declined. He continues to ask for pain solutions but unfortunately am limited because he is not approved for PAP.  - Start duloxetine 30 mg PO daily, increase to 60 mg PO daily after 1 week  - Referred to pain clinic   Polyneuropathy Associated With Underlying Disease (Hcc)   Last Assessment & Plan: Formatting of this note might be different from the original. Chronic, worsening due to poorly-controlled diabetes.  - Refilled amitriptyline 150-200 mg PO at bedtime  - Refilled cymbalta 60 mg PO daily (can consider increasing this)  - Refilled gabapentin 1200 mg PO BID (can consider increasing this or switching to lyrica)   Chest Pain, Atypical   Formatting of this note might be different from the original. 05/22/2017 ED and hospitalization for atypical chest pain with cath per cardiologist showing: patent distal RCA stent and otherwise normal coronary arteries and normal LVEF with normal filling pressures. I believe his left sided pain is musculoskeletal in etiology Last Assessment & Plan: Formatting of this note might be different from the original. No c/o CP SOB edema or DOE today. Did not f/u with cardiology in Sept - rec call to set up f/u with h/o CAD and Afib/flutter in past.  05/22/2017 ED and hospitalization for atypical chest pain  with cath per cardiologist showing: patent distal RCA stent and otherwise normal coronary arteries and normal LVEF with normal filling pressures. I believe his left sided pain is musculoskeletal in etiology   Diabetic Nephropathy Associated With Type 2 Diabetes Mellitus (Hcc)   Last Assessment & Plan: Formatting of this note might be different from the original. Continue lisinopril. Albuminuria check today looks good.     Plan of Care  Pharmacotherapy (Medications Ordered): No orders of the defined types were placed in this encounter.  Procedure Orders  NEUROLYSIS     Lab Orders  No laboratory test(s) ordered today   Imaging Orders  No imaging studies ordered today   Referral Orders         Ambulatory referral to Hand Surgery      Pharmacological management:  Opioid Analgesics: I will not be prescribing any opioids at this time Membrane stabilizer: I will not be prescribing any at this time Muscle relaxant: I will not be prescribing any at this time NSAID: I will not be prescribing any at this time Other analgesic(s): I will not be prescribing any at this time      Interventional Therapies  Risk Factors  Considerations:  Polysubstance abuse  homicidal ideations  Hx. ETOH abuse  Hx.  Marijuana abuse  T2IDDM  CAD  HTN  GERD  Gastroparesis     Planned  Pending:   Therapeutic bilateral lower extremity Qutenza treatment #1  Referral to hand surgeon for bilateral carpal tunnel release (04/17/2023)    Under consideration:   Therapeutic bilateral lower extremity Qutenza treatment #1  Diagnostic/therapeutic bilateral carpal tunnel (median nerve) block #1  Diagnostic/therapeutic left ulnar nerve block at the elbow #1  Diagnostic/therapeutic bilateral scaphoid-radial joint (small joint) injection #1  Diagnostic/therapeutic bilateral L3-4 (L3NR) TFESI #1  Diagnostic/therapeutic bilateral L4-5 (L4NR) TFESI #1  Diagnostic/therapeutic bilateral L5-S1 (L5NR) TFESI #1   Therapeutic bilateral L2-3, L3-4, and L4-5 MILD procedure #1  Diagnostic/therapeutic bilateral L3-4, L4-5 lumbar facet MBB #1  Diagnostic/therapeutic left knee genicular nerve block #1    Completed:   None at this time   Therapeutic  Palliative (PRN) options:   None established   Completed by other providers:   EMG/PNCV (03/11/2023): Conclusions: This is an abnormal study. These electrodiagnostic findings show evidence of bilateral,  (L>R), severe median neuropathies at the wrists. There is also evidence of a left ulnar neuropathy at the elbow. Electrodiagnostic findings in the arms are suggestive of a generalized polyneuropathy, as can be seen with diabetes. By Elisabeth Most, MD [facility])        Provider-requested follow-up: Return for [facility]): (B) LE Qutenza Tx #1. Recent Visits Date Type Provider Dept  03/11/23 Office Visit Delano Metz, MD [facility]  Showing recent visits within past 90 days and meeting all other requirements Today's Visits Date Type Provider Dept  04/17/23 Office Visit Delano Metz, MD [facility]  Showing today's visits and meeting all other requirements Future Appointments No visits were found meeting these conditions. Showing future appointments within next 90 days and meeting all other requirements   Primary Care Physician: Rehabilitation, [facility] Physical Medicine &  Duration of encounter: 117 minutes.  Total time on encounter, as per AMA guidelines included both the face-to-face and non-face-to-face time personally spent by the physician and/or other qualified health care professional(s) on the day of the encounter (includes time in activities that require the physician or other qualified health care professional and does not include time in activities normally performed by clinical staff). Physician's time may include the following activities when performed: Preparing to see the patient  (e.g., pre-charting review of records, searching for previously ordered imaging, lab work, and nerve conduction tests) Review of prior analgesic pharmacotherapies. Reviewing PMP Interpreting ordered tests (e.g., lab work, imaging, nerve conduction tests) Performing post-procedure evaluations, including interpretation of diagnostic procedures Obtaining and/or reviewing separately obtained history Performing a medically appropriate examination and/or evaluation Counseling and educating the patient/family/caregiver Ordering medications, tests, or procedures Referring and communicating with other health  care professionals (when not separately reported) Documenting clinical information in the electronic or other health record Independently interpreting results (not separately reported) and communicating results to the patient/ family/caregiver Care coordination (not separately reported)  Note by: Oswaldo Done, MD (TTS technology used. I apologize for any typographical errors that were not detected and corrected.) Date: 04/17/2023; Time: 4:52 PM

## 2023-04-17 ENCOUNTER — Ambulatory Visit: Payer: MEDICAID | Attending: Pain Medicine | Admitting: Pain Medicine

## 2023-04-17 ENCOUNTER — Encounter: Payer: Self-pay | Admitting: Pain Medicine

## 2023-04-17 VITALS — BP 116/68 | HR 77 | Temp 97.9°F | Resp 18 | Ht 72.0 in | Wt 209.0 lb

## 2023-04-17 DIAGNOSIS — M19132 Post-traumatic osteoarthritis, left wrist: Secondary | ICD-10-CM | POA: Insufficient documentation

## 2023-04-17 DIAGNOSIS — M79604 Pain in right leg: Secondary | ICD-10-CM | POA: Diagnosis present

## 2023-04-17 DIAGNOSIS — M25562 Pain in left knee: Secondary | ICD-10-CM | POA: Insufficient documentation

## 2023-04-17 DIAGNOSIS — G629 Polyneuropathy, unspecified: Secondary | ICD-10-CM | POA: Insufficient documentation

## 2023-04-17 DIAGNOSIS — M25532 Pain in left wrist: Secondary | ICD-10-CM | POA: Insufficient documentation

## 2023-04-17 DIAGNOSIS — G8929 Other chronic pain: Secondary | ICD-10-CM | POA: Insufficient documentation

## 2023-04-17 DIAGNOSIS — M19032 Primary osteoarthritis, left wrist: Secondary | ICD-10-CM | POA: Insufficient documentation

## 2023-04-17 DIAGNOSIS — R937 Abnormal findings on diagnostic imaging of other parts of musculoskeletal system: Secondary | ICD-10-CM | POA: Diagnosis present

## 2023-04-17 DIAGNOSIS — M79672 Pain in left foot: Secondary | ICD-10-CM | POA: Diagnosis present

## 2023-04-17 DIAGNOSIS — G5603 Carpal tunnel syndrome, bilateral upper limbs: Secondary | ICD-10-CM | POA: Diagnosis not present

## 2023-04-17 DIAGNOSIS — M79642 Pain in left hand: Secondary | ICD-10-CM | POA: Diagnosis present

## 2023-04-17 DIAGNOSIS — E1140 Type 2 diabetes mellitus with diabetic neuropathy, unspecified: Secondary | ICD-10-CM | POA: Insufficient documentation

## 2023-04-17 DIAGNOSIS — M79641 Pain in right hand: Secondary | ICD-10-CM | POA: Diagnosis not present

## 2023-04-17 DIAGNOSIS — M79605 Pain in left leg: Secondary | ICD-10-CM | POA: Insufficient documentation

## 2023-04-17 DIAGNOSIS — M48061 Spinal stenosis, lumbar region without neurogenic claudication: Secondary | ICD-10-CM | POA: Diagnosis present

## 2023-04-17 DIAGNOSIS — M2428 Disorder of ligament, vertebrae: Secondary | ICD-10-CM | POA: Diagnosis present

## 2023-04-17 DIAGNOSIS — R94130 Abnormal response to nerve stimulation, unspecified: Secondary | ICD-10-CM | POA: Diagnosis present

## 2023-04-17 DIAGNOSIS — M25531 Pain in right wrist: Secondary | ICD-10-CM | POA: Insufficient documentation

## 2023-04-17 DIAGNOSIS — G5622 Lesion of ulnar nerve, left upper limb: Secondary | ICD-10-CM | POA: Insufficient documentation

## 2023-04-17 DIAGNOSIS — M545 Low back pain, unspecified: Secondary | ICD-10-CM | POA: Diagnosis present

## 2023-04-17 DIAGNOSIS — M79671 Pain in right foot: Secondary | ICD-10-CM | POA: Insufficient documentation

## 2023-04-17 DIAGNOSIS — G5610 Other lesions of median nerve, unspecified upper limb: Secondary | ICD-10-CM | POA: Insufficient documentation

## 2023-04-17 NOTE — Patient Instructions (Signed)
Capsaicin Patches What is this medication? CAPSAICIN (cap SAY sin) relieves minor pain in your muscles and joints. It works by making your skin feel warm or cool, which blocks pain signals going to the brain. This medicine may be used for other purposes; ask your health care provider or pharmacist if you have questions. COMMON BRAND NAME(S): Qutenza What should I tell my care team before I take this medication? They need to know if you have any of these conditions: Broken or irritated skin High blood pressure History of heart attack or stroke An unusual or allergic reaction to capsaicin, hot peppers, other medications, foods, dyes, or preservatives Pregnant or trying to get pregnant Breast-feeding How should I use this medication? This medication is for external use only. It is applied by your care team in a hospital or clinic setting. Talk to your care team about the use of this medication in children. Special care may be needed. Overdosage: If you think you have taken too much of this medicine contact a poison control center or emergency room at once. NOTE: This medicine is only for you. Do not share this medicine with others. What if I miss a dose? This does not apply. What may interact with this medication? Interactions are not expected. Do not use any other skin products on the affected area without asking your care team. This list may not describe all possible interactions. Give your health care provider a list of all the medicines, herbs, non-prescription drugs, or dietary supplements you use. Also tell them if you smoke, drink alcohol, or use illegal drugs. Some items may interact with your medicine. What should I watch for while using this medication? Your condition will be monitored carefully while you are receiving this medication. Your blood pressure may go up during the procedure. Do not touch the medication patch during treatment. This medication causes red, burning skin. You  may need pain medication for during and after the procedure. This medication can make you more sensitive to heat for a few days after treatment. Be careful in hot showers or baths. Keep out of the sun. Exercise may make the treated skin feel hotter. Tell your care team if your symptoms do not start to get better or if they get worse. What side effects may I notice from receiving this medication? Side effects that you should report to your care team as soon as possible: Allergic reactions--skin rash, itching, hives, swelling of the face, lips, tongue, or throat Side effects that usually do not require medical attention (report these to your care team if they continue or are bothersome): Mild skin irritation, redness, or dryness This list may not describe all possible side effects. Call your doctor for medical advice about side effects. You may report side effects to FDA at 1-800-FDA-1088. Where should I keep my medication? This medication is given in a hospital or clinic. It will not be stored at home. NOTE: This sheet is a summary. It may not cover all possible information. If you have questions about this medicine, talk to your doctor, pharmacist, or health care provider.  2024 Elsevier/Gold Standard (2021-07-11 00:00:00)  ______________________________________________________________________  Procedure instructions  Do not eat or drink fluids (other than water) for 6 hours before your procedure  No water for 2 hours before your procedure  Take your blood pressure medicine with a sip of water  Arrive 30 minutes before your appointment  Carefully read the "Preparing for your procedure" detailed instructions  If you have questions call  us at [phone]  _____________________________________________________________________

## 2023-04-17 NOTE — Progress Notes (Signed)
Safety precautions to be maintained throughout the outpatient stay will include: orient to surroundings, keep bed in low position, maintain call bell within reach at all times, provide assistance with transfer out of bed and ambulation.  

## 2023-04-22 DIAGNOSIS — G5601 Carpal tunnel syndrome, right upper limb: Secondary | ICD-10-CM | POA: Insufficient documentation

## 2023-04-22 DIAGNOSIS — M65341 Trigger finger, right ring finger: Secondary | ICD-10-CM | POA: Insufficient documentation

## 2023-04-23 DIAGNOSIS — M25511 Pain in right shoulder: Secondary | ICD-10-CM | POA: Insufficient documentation

## 2023-04-26 DIAGNOSIS — E875 Hyperkalemia: Secondary | ICD-10-CM | POA: Insufficient documentation

## 2023-05-25 DIAGNOSIS — F331 Major depressive disorder, recurrent, moderate: Secondary | ICD-10-CM | POA: Insufficient documentation

## 2023-06-17 NOTE — Progress Notes (Unsigned)
PROVIDER NOTE: Information contained herein reflects review and annotations entered in association with encounter. Interpretation of such information and data should be left to medically-trained personnel. Information provided to patient can be located elsewhere in the medical record under "Patient Instructions". Document created using STT-dictation technology, any transcriptional errors that may result from process are unintentional.    Patient: Todd Wagner  Service Category: E/M  Provider: Oswaldo Done, MD  DOB: September 13, 1961  DOS: 06/19/2023  Referring Provider: [facility]  MRN: [id number]  Specialty: Interventional Pain Management  PCP: [facility]  Type: Established Patient  Setting: Ambulatory outpatient    Location: Office  Delivery: Face-to-face     HPI  [name], a 62 y.o. year old male, is here today because of his Chronic midline low back pain without sciatica [M54.50, G89.29]. [name] primary complain today is No chief complaint on file.  Pertinent problems: 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, L4-5, L5-S1) (Severe: L4-5); Lumbar central spinal stenosis w/o claudication; Old tear of medial meniscus of knee (Left); Osteoarthritis of hands (Bilateral); Chronic hand pain (1ry area of Pain) (Bilateral); Osteoarthritis of wrists (Bilateral); Low back pain of over 3 months duration; Chronic knee pain (3ry area of Pain)  (Left); Chronic low back pain (2ry area of Pain) (Midline) w/o sciatica; Lumbar facet joint pain; Chronic feet pain (5th area of Pain) (Bilateral) (R=L); Chronic lower extremity pain (4th area of Pain) (Bilateral) (R=L); Abnormal MRI, lumbar spine (01/26/2023); Carpal tunnel syndrome (Bilateral) (L>R); Ulnar neuropathy of upper extremity (Left); Generalized polyneuropathy; 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 Pain Assessment: Severity of   is reported as a  /10. Location:    / . Onset:  . Quality:  . Timing:  . Modifying factor(s):  Marland Kitchen Vitals:  vitals were not taken for this visit.  BMI: Estimated body mass index is 28.35 kg/m as calculated from the following:   Height as of 04/17/23: 6' (1.829 m).   Weight as of 04/17/23: 209 lb (94.8 kg). Last encounter: 04/17/2023. Last procedure: Visit date not found.  Reason for encounter: evaluation of worsening, or previously known (established) problem. ***  Pharmacotherapy Assessment  Analgesic: No chronic opioid analgesics therapy prescribed by our practice. None MME/day: 0 mg/day   Monitoring: [place] PMP: PDMP reviewed during this encounter.       Pharmacotherapy: No side-effects or adverse reactions reported. Compliance: No problems identified. Effectiveness: Clinically acceptable.  No notes on file  No results found for: "CBDTHCR" No results found for: "D8THCCBX" No results found for: "D9THCCBX"  UDS:  Summary  Date Value Ref Range Status  03/11/2023 Note  Final    Comment:    ==================================================================== Compliance Drug Analysis, Ur ==================================================================== Test  Result       Flag       Units  Drug Present and Declared for Prescription Verification   Gabapentin                     PRESENT      EXPECTED   Amitriptyline                  PRESENT      EXPECTED   Mirtazapine                    PRESENT      EXPECTED   Trazodone                      PRESENT      EXPECTED   1,3 chlorophenyl piperazine    PRESENT      EXPECTED    1,3-chlorophenyl piperazine is an expected metabolite of trazodone.    Metoprolol                     PRESENT      EXPECTED  Drug Absent but Declared for Prescription Verification   Hydrocodone                    Not Detected UNEXPECTED ng/mg creat   Acetaminophen                  Not Detected UNEXPECTED    Acetaminophen, as indicated in the declared medication list, is not    always detected even when used as directed.    Salicylate                     Not Detected UNEXPECTED    Aspirin, as indicated in the declared medication list, is not always    detected even when used as directed.    Diphenhydramine                Not Detected UNEXPECTED ==================================================================== Test                      Result    Flag   Units      Ref Range   Creatinine              67               mg/dL      >=29 ==================================================================== Declared Medications:  The flagging and interpretation on this report are based on the  following declared medications.  Unexpected results may arise from  inaccuracies in the declared medications.   **Note: The testing scope of this panel includes these medications:   Amitriptyline (Elavil)  Diphenhydramine (Sominex)  Gabapentin (Neurontin)  Hydrocodone (Norco)  Metoprolol (Lopressor)  Mirtazapine (Remeron)  Trazodone (Desyrel)   **Note: The testing scope of this panel does not include small to  moderate amounts of these reported medications:   Acetaminophen (Tylenol)  Acetaminophen (Norco)   Aspirin   **Note: The testing scope of this panel does not include the  following reported medications:   Albuterol (Ventolin HFA)  Atorvastatin (Lipitor)  Colchicine  Glipizide (Glucotrol)  Insulin (NovoLog)  Lisinopril (Zestril)  Melatonin  Meloxicam (Mobic)  Metformin (Glucophage)  Nitroglycerin (Nitrostat)  Omeprazole (Prilosec)  Ondansetron (Zofran) ==================================================================== For clinical consultation, please call [phone]. ====================================================================       ROS  Constitutional: Denies any fever or chills Gastrointestinal: No  reported hemesis, hematochezia, vomiting, or acute GI distress Musculoskeletal: Denies any acute onset joint swelling, redness, loss of ROM, or weakness Neurological: No reported episodes of acute onset apraxia, aphasia, dysarthria, agnosia, amnesia, paralysis, loss of coordination, or loss of consciousness  Medication Review  Accu-Chek Guide Me, Dexcom G7 Sensor, HYDROcodone-acetaminophen, Melatonin, acetaminophen, albuterol, amitriptyline, aspirin, atorvastatin, colchicine, diphenhydrAMINE, gabapentin, glipiZIDE, insulin aspart protamine - aspart, insulin glargine, lisinopril, meloxicam, metFORMIN, metoprolol tartrate, mirtazapine, nitroGLYCERIN, omeprazole, ondansetron, and traZODone  History Review  Allergy: Mr. Heberlein has No Known Allergies. Drug: Mr. Contreraz  reports current drug use. Drug: Marijuana. Alcohol:  reports current alcohol use. Tobacco:  reports that he has never smoked. He has never used smokeless tobacco. Social: Mr. Najarro  reports that he has never smoked. He has never used smokeless tobacco. He reports current alcohol use. He reports current drug use. Drug: Marijuana. Medical:  has a past medical history of Diabetes mellitus without complication (HCC) and Hypertension. Surgical: Mr. Chhabra  has a past surgical history that includes  Appendectomy; Coronary angioplasty with stent; and LEFT HEART CATH AND CORONARY ANGIOGRAPHY (N/A, 05/23/2017). Family: family history is not on file.  Laboratory Chemistry Profile   Renal Lab Results  Component Value Date   BUN 22 03/11/2023   CREATININE 1.58 (H) 03/11/2023   BCR 14 03/11/2023   GFRAA >60 06/16/2017   GFRNONAA >60 06/16/2017    Hepatic Lab Results  Component Value Date   AST 14 03/11/2023   ALT 29 06/16/2017   ALBUMIN 4.5 03/11/2023   ALKPHOS 144 (H) 03/11/2023   LIPASE 18 06/16/2017    Electrolytes Lab Results  Component Value Date   NA 133 (L) 03/11/2023   K CANCELED 03/11/2023   CL 101 03/11/2023   CALCIUM 9.3 03/11/2023   MG 1.7 03/11/2023    Bone Lab Results  Component Value Date   25OHVITD1 27 (L) 03/11/2023   25OHVITD2 7.5 03/11/2023   25OHVITD3 19 03/11/2023    Inflammation (CRP: Acute Phase) (ESR: Chronic Phase) Lab Results  Component Value Date   CRP 2 03/11/2023   ESRSEDRATE 16 03/11/2023         Note: Above Lab results reviewed.  Recent Imaging Review  DG Lumbar Spine Complete W/Bend CLINICAL DATA:  Low back pain for 3 months without sciatica.  EXAM: LUMBAR SPINE - COMPLETE WITH BENDING VIEWS  COMPARISON:  CT abdomen and pelvis, 7252  FINDINGS: No fracture.  No bone lesion.  Grade 1 anterolisthesis of L4 on L5. Mild loss of disc height at L4-L5. No other malalignment. Remaining disc spaces are well preserved. Mild facet degenerative change at L4-L5.  Generalized diffuse increase in bowel gas without bowel dilation.  IMPRESSION: 1. No fracture or acute finding. 2. Degenerative changes at L4-L5 with a grade 1 anterolisthesis. Degenerative changes are similar to the prior CT. Anterolisthesis was not present on the prior CT.  Electronically Signed   By: Amie Portland M.D.   On: 03/15/2023 14:29 DG Hand Complete Right CLINICAL DATA:  Chronic bilateral hand pain.  EXAM: RIGHT HAND - COMPLETE 3+ VIEW  COMPARISON:   Right wrist radiographs, 07/28/2013.  FINDINGS: No fracture.  No bone lesion.  Joint space narrowing, mild subchondral sclerosis and cystic change and marginal osteophytes noted at fourth finger DIP joint.  Joint space narrowing with mild subchondral sclerosis and small marginal osteophytes noted at the radial scaphoid articulation. Remaining joints normally spaced and aligned  Small cysts noted, base of the second metacarpal and in the distal triquetrum.  Soft tissues are unremarkable.  IMPRESSION: 1. No fracture or acute finding. 2. Degenerative/arthropathic changes at the radial scaphoid articulation similar to the prior wrist radiographs. 3. Moderate osteoarthritis involving the fourth finger DIP joint.  Electronically Signed   By: Amie Portland M.D.   On: 03/15/2023 14:26 DG Hand Complete Left CLINICAL DATA:  Chronic bilateral hand pain.  EXAM: LEFT HAND - COMPLETE 3+ VIEW  COMPARISON:  Left wrist radiographs, 07/24/2019.  FINDINGS: No fracture or acute finding.  No bone lesion.  Marked narrowing of the radial scaphoid joint with subchondral cystic change mild sclerosis and a bony prominence from the radial margin the scaphoid stable compared to the prior exam.  Remaining joints are normally spaced and aligned.  Soft tissues are unremarkable.  IMPRESSION: 1. No fracture or acute finding. 2. Stable arthropathic changes at the radial scaphoid articulation, which may reflect posttraumatic osteoarthritis. 3. No other arthropathic changes.  Electronically Signed   By: Amie Portland M.D.   On: 03/15/2023 14:24 Note: Reviewed        Physical Exam  General appearance: Well nourished, well developed, and well hydrated. In no apparent acute distress Mental status: Alert, oriented x 3 (person, place, & time)       Respiratory: No evidence of acute respiratory distress Eyes: PERLA Vitals: There were no vitals taken for this visit. BMI: Estimated body mass index  is 28.35 kg/m as calculated from the following:   Height as of 04/17/23: 6' (1.829 m).   Weight as of 04/17/23: 209 lb (94.8 kg). Ideal: Patient weight not recorded  Assessment   Diagnosis Status  1. Chronic low back pain (2ry area of Pain) (Midline) w/o sciatica   2. Chronic knee pain (3ry area of Pain) (Left)   3. Chronic lower extremity pain (4th area of Pain) (Bilateral) (R=L)   4. Low back pain of over 3 months duration   5. 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 Lumbar central spinal stenosis w/o claudication   7. Foraminal stenosis of lumbar region   8. Chronic feet pain (5th area of Pain) (Bilateral) (R=L)   9. Chronic painful diabetic neuropathy (HCC)   10. Generalized polyneuropathy   11. Abnormal NCS (UE)(nerve conduction studies) (03/11/2023)   12. Abnormal MRI, lumbar spine (01/26/2023)    Controlled Controlled Controlled   Updated Problems: No problems updated.  Plan of Care  Problem-specific:  No problem-specific Assessment & Plan notes found for this encounter.  Mr. Brier Chrostowski has a current medication list which includes the following long-term medication(s): amitriptyline, atorvastatin, colchicine, diphenhydramine, gabapentin, glipizide, insulin aspart protamine - aspart, lisinopril, metoprolol tartrate, mirtazapine, nitroglycerin, omeprazole, trazodone, and ventolin hfa.  Pharmacotherapy (Medications Ordered): No orders of the defined types were placed in this encounter.  Orders:  No orders of the defined types were placed in this encounter.  Follow-up plan:   No follow-ups on file.      Interventional Therapies  Risk Factors  Considerations:  Polysubstance abuse  homicidal ideations  Hx. ETOH abuse  Hx.  Marijuana abuse  T2IDDM  CAD  HTN  GERD  Gastroparesis     Planned  Pending:   Therapeutic bilateral lower extremity Qutenza treatment #1  Referral to hand surgeon for bilateral carpal tunnel release (04/17/2023)     Under consideration:   Therapeutic bilateral lower extremity Qutenza treatment #1  Diagnostic/therapeutic bilateral carpal tunnel (median nerve) block #1  Diagnostic/therapeutic left ulnar nerve block at the elbow #1  Diagnostic/therapeutic bilateral scaphoid-radial joint (  small joint) injection #1  Diagnostic/therapeutic bilateral L3-4 (L3NR) TFESI #1  Diagnostic/therapeutic bilateral L4-5 (L4NR) TFESI #1  Diagnostic/therapeutic bilateral L5-S1 (L5NR) TFESI #1  Therapeutic bilateral L2-3, L3-4, and L4-5 MILD procedure #1  Diagnostic/therapeutic bilateral L3-4, L4-5 lumbar facet MBB #1  Diagnostic/therapeutic left knee genicular nerve block #1    Completed:   None at this time   Therapeutic  Palliative (PRN) options:   None established   Completed by other providers:   EMG/PNCV (03/11/2023): Conclusions: This is an abnormal study. These electrodiagnostic findings show evidence of bilateral,  (L>R), severe median neuropathies at the wrists. There is also evidence of a left ulnar neuropathy at the elbow. Electrodiagnostic findings in the arms are suggestive of a generalized polyneuropathy, as can be seen with diabetes. By Elisabeth Most, MD [facility])         Recent Visits Date Type Provider Dept  04/17/23 Office Visit Delano Metz, MD [facility]  Showing recent visits within past 90 days and meeting all other requirements Future Appointments Date Type Provider Dept  06/19/23 Appointment Delano Metz, MD [facility]  Showing future appointments within next 90 days and meeting all other requirements  I discussed the assessment and treatment plan with the patient. The patient was provided an opportunity to ask questions and all were answered. The patient agreed with the plan and demonstrated an understanding of the instructions.  Patient advised to call back or seek an in-person evaluation if the symptoms or condition  worsens.  Duration of encounter: *** minutes.  Total time on encounter, as per AMA guidelines included both the face-to-face and non-face-to-face time personally spent by the physician and/or other qualified health care professional(s) on the day of the encounter (includes time in activities that require the physician or other qualified health care professional and does not include time in activities normally performed by clinical staff). Physician's time may include the following activities when performed: Preparing to see the patient (e.g., pre-charting review of records, searching for previously ordered imaging, lab work, and nerve conduction tests) Review of prior analgesic pharmacotherapies. Reviewing PMP Interpreting ordered tests (e.g., lab work, imaging, nerve conduction tests) Performing post-procedure evaluations, including interpretation of diagnostic procedures Obtaining and/or reviewing separately obtained history Performing a medically appropriate examination and/or evaluation Counseling and educating the patient/family/caregiver Ordering medications, tests, or procedures Referring and communicating with other health care professionals (when not separately reported) Documenting clinical information in the electronic or other health record Independently interpreting results (not separately reported) and communicating results to the patient/ family/caregiver Care coordination (not separately reported)  Note by: Oswaldo Done, MD Date: 06/19/2023; Time: 7:49 PM

## 2023-06-19 ENCOUNTER — Ambulatory Visit: Payer: MEDICAID | Attending: Pain Medicine | Admitting: Pain Medicine

## 2023-06-19 ENCOUNTER — Encounter: Payer: Self-pay | Admitting: Pain Medicine

## 2023-06-19 VITALS — BP 127/74 | HR 78 | Temp 98.8°F | Ht 72.0 in | Wt 209.0 lb

## 2023-06-19 DIAGNOSIS — M7530 Calcific tendinitis of unspecified shoulder: Secondary | ICD-10-CM | POA: Insufficient documentation

## 2023-06-19 DIAGNOSIS — Z9889 Other specified postprocedural states: Secondary | ICD-10-CM | POA: Diagnosis present

## 2023-06-19 DIAGNOSIS — R937 Abnormal findings on diagnostic imaging of other parts of musculoskeletal system: Secondary | ICD-10-CM

## 2023-06-19 DIAGNOSIS — M79605 Pain in left leg: Secondary | ICD-10-CM | POA: Insufficient documentation

## 2023-06-19 DIAGNOSIS — M65341 Trigger finger, right ring finger: Secondary | ICD-10-CM | POA: Insufficient documentation

## 2023-06-19 DIAGNOSIS — G8929 Other chronic pain: Secondary | ICD-10-CM | POA: Insufficient documentation

## 2023-06-19 DIAGNOSIS — M79641 Pain in right hand: Secondary | ICD-10-CM | POA: Insufficient documentation

## 2023-06-19 DIAGNOSIS — Z794 Long term (current) use of insulin: Secondary | ICD-10-CM

## 2023-06-19 DIAGNOSIS — E1140 Type 2 diabetes mellitus with diabetic neuropathy, unspecified: Secondary | ICD-10-CM | POA: Diagnosis present

## 2023-06-19 DIAGNOSIS — M79672 Pain in left foot: Secondary | ICD-10-CM | POA: Diagnosis present

## 2023-06-19 DIAGNOSIS — M79671 Pain in right foot: Secondary | ICD-10-CM | POA: Insufficient documentation

## 2023-06-19 DIAGNOSIS — G8918 Other acute postprocedural pain: Secondary | ICD-10-CM | POA: Diagnosis present

## 2023-06-19 DIAGNOSIS — G629 Polyneuropathy, unspecified: Secondary | ICD-10-CM | POA: Diagnosis present

## 2023-06-19 DIAGNOSIS — M7700 Medial epicondylitis, unspecified elbow: Secondary | ICD-10-CM | POA: Insufficient documentation

## 2023-06-19 DIAGNOSIS — M48061 Spinal stenosis, lumbar region without neurogenic claudication: Secondary | ICD-10-CM

## 2023-06-19 DIAGNOSIS — M2428 Disorder of ligament, vertebrae: Secondary | ICD-10-CM

## 2023-06-19 DIAGNOSIS — G5601 Carpal tunnel syndrome, right upper limb: Secondary | ICD-10-CM | POA: Insufficient documentation

## 2023-06-19 DIAGNOSIS — M79604 Pain in right leg: Secondary | ICD-10-CM | POA: Insufficient documentation

## 2023-06-19 DIAGNOSIS — R94130 Abnormal response to nerve stimulation, unspecified: Secondary | ICD-10-CM | POA: Insufficient documentation

## 2023-06-19 DIAGNOSIS — M545 Other chronic pain: Secondary | ICD-10-CM

## 2023-06-19 MED ORDER — PREDNISONE 20 MG PO TABS
ORAL_TABLET | ORAL | 0 refills | Status: AC
Start: 2023-06-19 — End: 2023-06-28

## 2023-06-19 NOTE — Progress Notes (Signed)
Safety precautions to be maintained throughout the outpatient stay will include: orient to surroundings, keep bed in low position, maintain call bell within reach at all times, provide assistance with transfer out of bed and ambulation.  

## 2023-06-26 ENCOUNTER — Telehealth: Payer: Self-pay | Admitting: Pain Medicine

## 2023-06-26 NOTE — Telephone Encounter (Signed)
Order was place for patient to go to physical therapy. PT wants to go to [facility] to get physical therapy done. The referral states PT however they need the referral to be replace and to state OT. So they can schedule patient with a appointment. TY

## 2023-06-26 NOTE — Telephone Encounter (Signed)
[place] spoke with PT in [place], they said for "insurance purposes" the order should be changed to OT. Will you do this?

## 2023-07-01 ENCOUNTER — Telehealth: Payer: Self-pay | Admitting: Pain Medicine

## 2023-07-01 ENCOUNTER — Other Ambulatory Visit: Payer: Self-pay | Admitting: Pain Medicine

## 2023-07-01 DIAGNOSIS — M79641 Pain in right hand: Secondary | ICD-10-CM

## 2023-07-01 DIAGNOSIS — G8929 Other chronic pain: Secondary | ICD-10-CM

## 2023-07-01 NOTE — Telephone Encounter (Signed)
[place] pt needs the referral to be send back over to their office. In order for them to see this patient his referral will need to be change to OT instead of PT.Once it's place I will fax back over so patient can get schedule. TY

## 2023-07-09 NOTE — Progress Notes (Unsigned)
PROVIDER NOTE: Information contained herein reflects review and annotations entered in association with encounter. Interpretation of such information and data should be left to medically-trained personnel. Information provided to patient can be located elsewhere in the medical record under "Patient Instructions". Document created using STT-dictation technology, any transcriptional errors that may result from process are unintentional.    Patient: Todd Wagner  Service Category: E/M  Provider: Oswaldo Done, MD  DOB: 12/29/1960  DOS: 07/10/2023  Referring Provider: [facility]  MRN: [id number]  Specialty: Interventional Pain Management  PCP: [facility]  Type: Established Patient  Setting: Ambulatory outpatient    Location: Office  Delivery: Face-to-face     HPI  Todd Wagner, a 62 y.o. year old male, is here today because of his No primary diagnosis found.. Todd Wagner primary complain today is No chief complaint on file.  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, L4-5, L5-S1) (Severe: L4-5); Lumbar central spinal stenosis w/o claudication; Old tear of medial meniscus of knee (Left); Osteoarthritis of hands (Bilateral); Chronic hand pain (1ry area of Pain) (Bilateral); Osteoarthritis of wrists (Bilateral); Low back pain of over 3 months duration; Chronic knee pain (3ry area of Pain) (Left); Chronic low back pain (2ry  area of Pain) (Midline) w/o sciatica; Lumbar facet joint pain; Chronic feet pain (5th area of Pain) (Bilateral) (R=L); Chronic lower extremity pain (4th area of Pain) (Bilateral) (R=L); Abnormal MRI, lumbar spine (01/26/2023); Carpal tunnel syndrome (Bilateral) (L>R); Ulnar neuropathy of upper extremity (Left); Generalized polyneuropathy; Hypertrophy of ligamentum flavum (L2-3, L3-4, L4-5); Abnormal NCS (UE)(nerve conduction studies) (03/11/2023); Traumatic osteoarthritis of wrist (Left); Arthropathy of wrist (Left); Chronic painful diabetic neuropathy (HCC); Neuropathy, median nerve (Bilateral) (L>R); Cubital tunnel syndrome (Left); Acute pain of right shoulder; Calcific tendinitis of shoulder; Medial epicondylitis; Ring finger, trigger finger (Right); Carpal tunnel syndrome (Right); Polyarthralgia; History of carpal tunnel release (05/31/2023) (Right); S/P ring finger trigger finger release (05/31/2023) (Right); Non-articular hand pain (Right); and S/P carpal tunnel release (05/31/2023) (Right) on their pertinent problem list. Pain Assessment: Severity of   is reported as a  /10. Location:    / . Onset:  . Quality:  . Timing:  . Modifying factor(s):  Marland Kitchen Vitals:  vitals were not taken for this visit.  BMI: Estimated body mass index is 28.35 kg/m as calculated from the following:   Height as of 06/19/23: 6' (1.829 m).   Weight as of 06/19/23: 209 lb (94.8 kg). Last encounter: 06/19/2023. Last procedure: Visit date not found.  Reason for encounter:  *** . ***  Pharmacotherapy Assessment  Analgesic: No chronic opioid analgesics therapy prescribed by our practice. None MME/day: 0 mg/day   Monitoring: [place] PMP: PDMP reviewed during this encounter.       Pharmacotherapy: No side-effects or adverse reactions reported. Compliance: No problems identified. Effectiveness: Clinically acceptable.  No notes on file  No results found for: "CBDTHCR" No results found for: "D8THCCBX" No results found for: "D9THCCBX"   UDS:  Summary  Date Value Ref Range Status  03/11/2023 Note  Final    Comment:    ====================================================================  Compliance Drug Analysis, Ur ==================================================================== Test                             Result       Flag       Units  Drug Present and Declared for Prescription Verification   Gabapentin                     PRESENT      EXPECTED   Amitriptyline                  PRESENT      EXPECTED   Mirtazapine                    PRESENT      EXPECTED   Trazodone                      PRESENT      EXPECTED   1,3 chlorophenyl piperazine    PRESENT      EXPECTED    1,3-chlorophenyl piperazine is an expected metabolite of trazodone.    Metoprolol                     PRESENT      EXPECTED  Drug Absent but Declared for Prescription Verification   Hydrocodone                    Not Detected UNEXPECTED ng/mg creat   Acetaminophen                  Not Detected UNEXPECTED    Acetaminophen, as indicated in the declared medication list, is not    always detected even when used as directed.    Salicylate                     Not Detected UNEXPECTED    Aspirin, as indicated in the declared medication list, is not always    detected even when used as directed.    Diphenhydramine                Not Detected UNEXPECTED ==================================================================== Test                      Result    Flag   Units      Ref Range   Creatinine              67               mg/dL      >=08 ==================================================================== Declared Medications:  The flagging and interpretation on this report are based on the  following declared medications.  Unexpected results may arise from  inaccuracies in the declared medications.   **Note: The testing scope of this panel includes these medications:   Amitriptyline (Elavil)  Diphenhydramine (Sominex)  Gabapentin (Neurontin)   Hydrocodone (Norco)  Metoprolol (Lopressor)  Mirtazapine (Remeron)  Trazodone (Desyrel)   **Note: The testing scope of this panel does not include small to  moderate amounts of these reported medications:   Acetaminophen (Tylenol)  Acetaminophen (Norco)  Aspirin   **Note: The testing scope of this panel does not include the  following reported medications:   Albuterol (Ventolin HFA)  Atorvastatin (Lipitor)  Colchicine  Glipizide (Glucotrol)  Insulin (NovoLog)  Lisinopril (Zestril)  Melatonin  Meloxicam (Mobic)  Metformin (Glucophage)  Nitroglycerin (Nitrostat)  Omeprazole (Prilosec)  Ondansetron (Zofran) ==================================================================== For clinical consultation, please call [phone]. ====================================================================       ROS  Constitutional: Denies any fever or chills Gastrointestinal: No reported hemesis, hematochezia, vomiting, or acute GI distress Musculoskeletal: Denies any acute onset joint swelling, redness, loss of ROM, or weakness Neurological: No reported episodes of acute onset apraxia, aphasia, dysarthria, agnosia, amnesia, paralysis, loss of coordination, or loss of consciousness  Medication Review  Accu-Chek Guide Me, Dexcom G7 Sensor, HYDROcodone-acetaminophen, Melatonin, acetaminophen, albuterol, amitriptyline, aspirin, atorvastatin, colchicine, diphenhydrAMINE, gabapentin, glipiZIDE, insulin aspart protamine - aspart, insulin glargine, lisinopril, meloxicam, metFORMIN, metoprolol tartrate, mirtazapine, nitroGLYCERIN, omeprazole, ondansetron, and traZODone  History Review  Allergy: Todd Wagner has No Known Allergies. Drug: Todd Wagner  reports current drug use. Drug: Marijuana. Alcohol:  reports current alcohol use. Tobacco:  reports that he has never smoked. He has never used smokeless tobacco. Social: Todd Wagner  reports that he has never smoked. He has never used smokeless  tobacco. He reports current alcohol use. He reports current drug use. Drug: Marijuana. Medical:  has a past medical history of Diabetes mellitus without complication (HCC) and Hypertension. Surgical: Todd Wagner  has a past surgical history that includes Appendectomy; Coronary angioplasty with stent; and LEFT HEART CATH AND CORONARY ANGIOGRAPHY (N/A, 05/23/2017). Family: family history is not on file.  Laboratory Chemistry Profile   Renal Lab Results  Component Value Date   BUN 22 03/11/2023   CREATININE 1.58 (H) 03/11/2023   BCR 14 03/11/2023   GFRAA >60 06/16/2017   GFRNONAA >60 06/16/2017    Hepatic Lab Results  Component Value Date   AST 14 03/11/2023   ALT 29 06/16/2017   ALBUMIN 4.5 03/11/2023   ALKPHOS 144 (H) 03/11/2023   LIPASE 18 06/16/2017    Electrolytes Lab Results  Component Value Date   NA 133 (L) 03/11/2023   K CANCELED 03/11/2023   CL 101 03/11/2023   CALCIUM 9.3 03/11/2023   MG 1.7 03/11/2023    Bone Lab Results  Component Value Date   25OHVITD1 27 (L) 03/11/2023   25OHVITD2 7.5 03/11/2023   25OHVITD3 19 03/11/2023    Inflammation (CRP: Acute Phase) (ESR: Chronic Phase) Lab Results  Component Value Date   CRP 2 03/11/2023   ESRSEDRATE 16 03/11/2023         Note: Above Lab results reviewed.  Recent Imaging Review  DG Lumbar Spine Complete W/Bend CLINICAL DATA:  Low back pain for 3 months without sciatica.  EXAM: LUMBAR SPINE - COMPLETE WITH BENDING VIEWS  COMPARISON:  CT abdomen and pelvis, 7252  FINDINGS: No fracture.  No bone lesion.  Grade 1 anterolisthesis of L4 on L5. Mild loss of disc height at L4-L5. No other malalignment. Remaining disc spaces are well preserved. Mild facet degenerative change at L4-L5.  Generalized diffuse increase in bowel gas without bowel dilation.  IMPRESSION: 1. No fracture or acute finding. 2. Degenerative changes at L4-L5 with a grade 1 anterolisthesis. Degenerative changes are similar to the prior  CT. Anterolisthesis was not present on the prior CT.  Electronically Signed   By: Amie Portland M.D.   On: 03/15/2023 14:29 DG Hand Complete Right CLINICAL DATA:  Chronic bilateral hand pain.  EXAM: RIGHT HAND - COMPLETE 3+ VIEW  COMPARISON:  Right wrist radiographs, 07/28/2013.  FINDINGS: No fracture.  No bone lesion.  Joint space narrowing, mild subchondral sclerosis and cystic change and marginal osteophytes noted at fourth finger DIP joint.  Joint space narrowing with  mild subchondral sclerosis and small marginal osteophytes noted at the radial scaphoid articulation. Remaining joints normally spaced and aligned  Small cysts noted, base of the second metacarpal and in the distal triquetrum.  Soft tissues are unremarkable.  IMPRESSION: 1. No fracture or acute finding. 2. Degenerative/arthropathic changes at the radial scaphoid articulation similar to the prior wrist radiographs. 3. Moderate osteoarthritis involving the fourth finger DIP joint.  Electronically Signed   By: Amie Portland M.D.   On: 03/15/2023 14:26 DG Hand Complete Left CLINICAL DATA:  Chronic bilateral hand pain.  EXAM: LEFT HAND - COMPLETE 3+ VIEW  COMPARISON:  Left wrist radiographs, 07/24/2019.  FINDINGS: No fracture or acute finding.  No bone lesion.  Marked narrowing of the radial scaphoid joint with subchondral cystic change mild sclerosis and a bony prominence from the radial margin the scaphoid stable compared to the prior exam.  Remaining joints are normally spaced and aligned.  Soft tissues are unremarkable.  IMPRESSION: 1. No fracture or acute finding. 2. Stable arthropathic changes at the radial scaphoid articulation, which may reflect posttraumatic osteoarthritis. 3. No other arthropathic changes.  Electronically Signed   By: Amie Portland M.D.   On: 03/15/2023 14:24 Note: Reviewed        Physical Exam  General appearance: Well nourished, well developed, and well  hydrated. In no apparent acute distress Mental status: Alert, oriented x 3 (person, place, & time)       Respiratory: No evidence of acute respiratory distress Eyes: PERLA Vitals: There were no vitals taken for this visit. BMI: Estimated body mass index is 28.35 kg/m as calculated from the following:   Height as of 06/19/23: 6' (1.829 m).   Weight as of 06/19/23: 209 lb (94.8 kg). Ideal: Patient weight not recorded  Assessment   Diagnosis Status  No diagnosis found. Controlled Controlled Controlled   Updated Problems: No problems updated.  Plan of Care  Problem-specific:  No problem-specific Assessment & Plan notes found for this encounter.  Todd Wagner has a current medication list which includes the following long-term medication(s): amitriptyline, atorvastatin, colchicine, diphenhydramine, gabapentin, glipizide, insulin aspart protamine - aspart, lisinopril, metoprolol tartrate, mirtazapine, nitroglycerin, omeprazole, trazodone, and ventolin hfa.  Pharmacotherapy (Medications Ordered): No orders of the defined types were placed in this encounter.  Orders:  No orders of the defined types were placed in this encounter.  Follow-up plan:   No follow-ups on file.      Interventional Therapies  Risk Factors  Considerations:  Polysubstance abuse  homicidal ideations  Hx. ETOH abuse  Hx.  Marijuana abuse  T2IDDM  CAD  HTN  GERD  Gastroparesis     Planned  Pending:   Therapeutic bilateral lower extremity Qutenza treatment #1  Referral to hand surgeon for bilateral carpal tunnel release (04/17/2023)    Under consideration:   Therapeutic bilateral lower extremity Qutenza treatment #1  Diagnostic/therapeutic bilateral carpal tunnel (median nerve) block #1  Diagnostic/therapeutic left ulnar nerve block at the elbow #1  Diagnostic/therapeutic bilateral scaphoid-radial joint (small joint) injection #1  Diagnostic/therapeutic bilateral L3-4 (L3NR) TFESI #1   Diagnostic/therapeutic bilateral L4-5 (L4NR) TFESI #1  Diagnostic/therapeutic bilateral L5-S1 (L5NR) TFESI #1  Therapeutic bilateral L2-3, L3-4, and L4-5 MILD procedure #1  Diagnostic/therapeutic bilateral L3-4, L4-5 lumbar facet MBB #1  Diagnostic/therapeutic left knee genicular nerve block #1    Completed:   None at this time   Therapeutic  Palliative (PRN) options:   None established   Completed by other providers:   EMG/PNCV (  03/11/2023): Conclusions: This is an abnormal study. These electrodiagnostic findings show evidence of bilateral,  (L>R), severe median neuropathies at the wrists. There is also evidence of a left ulnar neuropathy at the elbow. Electrodiagnostic findings in the arms are suggestive of a generalized polyneuropathy, as can be seen with diabetes. By Elisabeth Most, MD [facility] Neurology)        Recent Visits Date Type Provider Dept  06/19/23 Office Visit Delano Metz, MD [facility]  04/17/23 Office Visit Delano Metz, MD [facility]  Showing recent visits within past 90 days and meeting all other requirements Future Appointments Date Type Provider Dept  07/10/23 Appointment Delano Metz, MD [facility]  Showing future appointments within next 90 days and meeting all other requirements  I discussed the assessment and treatment plan with the patient. The patient was provided an opportunity to ask questions and all were answered. The patient agreed with the plan and demonstrated an understanding of the instructions.  Patient advised to call back or seek an in-person evaluation if the symptoms or condition worsens.  Duration of encounter: *** minutes.  Total time on encounter, as per AMA guidelines included both the face-to-face and non-face-to-face time personally spent by the physician and/or other qualified health care professional(s) on the day of the encounter (includes time in activities that require the  physician or other qualified health care professional and does not include time in activities normally performed by clinical staff). Physician's time may include the following activities when performed: Preparing to see the patient (e.g., pre-charting review of records, searching for previously ordered imaging, lab work, and nerve conduction tests) Review of prior analgesic pharmacotherapies. Reviewing PMP Interpreting ordered tests (e.g., lab work, imaging, nerve conduction tests) Performing post-procedure evaluations, including interpretation of diagnostic procedures Obtaining and/or reviewing separately obtained history Performing a medically appropriate examination and/or evaluation Counseling and educating the patient/family/caregiver Ordering medications, tests, or procedures Referring and communicating with other health care professionals (when not separately reported) Documenting clinical information in the electronic or other health record Independently interpreting results (not separately reported) and communicating results to the patient/ family/caregiver Care coordination (not separately reported)  Note by: Oswaldo Done, MD Date: 07/10/2023; Time: 10:41 AM

## 2023-07-10 ENCOUNTER — Ambulatory Visit: Payer: MEDICAID | Attending: Pain Medicine | Admitting: Pain Medicine

## 2023-07-10 ENCOUNTER — Encounter: Payer: Self-pay | Admitting: Pain Medicine

## 2023-07-10 VITALS — BP 145/78 | HR 76 | Temp 97.4°F | Ht 72.0 in | Wt 210.0 lb

## 2023-07-10 DIAGNOSIS — E1140 Type 2 diabetes mellitus with diabetic neuropathy, unspecified: Secondary | ICD-10-CM

## 2023-07-10 DIAGNOSIS — E1121 Type 2 diabetes mellitus with diabetic nephropathy: Secondary | ICD-10-CM | POA: Diagnosis present

## 2023-07-10 DIAGNOSIS — M79641 Pain in right hand: Secondary | ICD-10-CM

## 2023-07-10 DIAGNOSIS — M79642 Pain in left hand: Secondary | ICD-10-CM

## 2023-07-10 DIAGNOSIS — M79671 Pain in right foot: Secondary | ICD-10-CM | POA: Insufficient documentation

## 2023-07-10 DIAGNOSIS — R937 Abnormal findings on diagnostic imaging of other parts of musculoskeletal system: Secondary | ICD-10-CM | POA: Diagnosis present

## 2023-07-10 DIAGNOSIS — E1142 Type 2 diabetes mellitus with diabetic polyneuropathy: Secondary | ICD-10-CM

## 2023-07-10 DIAGNOSIS — M79672 Pain in left foot: Secondary | ICD-10-CM | POA: Diagnosis present

## 2023-07-10 DIAGNOSIS — G63 Polyneuropathy in diseases classified elsewhere: Secondary | ICD-10-CM | POA: Diagnosis present

## 2023-07-10 DIAGNOSIS — F1011 Alcohol abuse, in remission: Secondary | ICD-10-CM

## 2023-07-10 DIAGNOSIS — G8929 Other chronic pain: Secondary | ICD-10-CM | POA: Insufficient documentation

## 2023-07-10 DIAGNOSIS — M545 Low back pain, unspecified: Secondary | ICD-10-CM

## 2023-07-10 DIAGNOSIS — M25562 Pain in left knee: Secondary | ICD-10-CM | POA: Diagnosis present

## 2023-07-10 DIAGNOSIS — M79605 Pain in left leg: Secondary | ICD-10-CM

## 2023-07-10 DIAGNOSIS — Z794 Long term (current) use of insulin: Secondary | ICD-10-CM | POA: Diagnosis present

## 2023-07-10 DIAGNOSIS — M79604 Pain in right leg: Secondary | ICD-10-CM | POA: Diagnosis present

## 2023-07-10 NOTE — Progress Notes (Signed)
Safety precautions to be maintained throughout the outpatient stay will include: orient to surroundings, keep bed in low position, maintain call bell within reach at all times, provide assistance with transfer out of bed and ambulation.  

## 2023-07-10 NOTE — Patient Instructions (Addendum)
______________________________________________________________________    OTC Supplements:   The following is a list of over-the-counter (OTC) supplements that have been found to have [employer]) studies suggesting that they may be of some benefits when used in moderation in some chronic pain-related conditions.  NOTE:  Always consult with your primary care provider and/or pharmacist before taking any OTC medications to make sure they will not interact with your current medications. Always use manufacturer's recommended dosage.  Supplement Possible benefit May be of benefit in treatment of  Active ingredient(s)  Turmeric/curcumin anti-inflammatory Joint and muscle aches and pain associated with arthritis and inflammation The main active ingredient in turmeric is curcumin.  Glucosamine/chondroitin (triple strength) may slow loss of articular cartilage Osteoarthritis glucosamine HCl and chondroitin sulfate  Vitamin D-3 may suppress release of chemicals associated with inflammation Joint and muscle aches and pain associated with arthritis and inflammation CHOLECALCIFEROL; ALPHA.-TOCOPHEROL, D  Moringa anti-inflammatory with mild analgesic effects Joint and muscle aches and pain associated with arthritis and inflammation Many:  Phenolic acids: Gallic acid, ellagic acid, ferulic acid, caffeic acid, o-coumaric acid, and chlorogenic acid. Flavonoids: Rutin, quercetin, rhamnetin, kaempferol, apigenin, and myricetin. Terpenes: Lutein, all-E-luteoxanthin, 13-Z-lutein, 15-Z-?-carotene, and all-E-zeaxanthin. Alkaloids: Marumoside A, marumoside B, and pyrrolemarumine-4?-O-?-L-rhamnopyranoside. Vitamins: Vitamin A and vitamin C. Protein: Milk protein  Melatonin Helps reset sleep cycle. Insomnia. May also be helpful in neurodegenerative disorders melatonin, also known as N-acetyl-5-methoxytryptamine  Vitamin B-12 may help keep nerves and blood cells healthy as well as maintaining function of  nervous system Neuropathies. Nerve pain (Burning pain) methylcobalamin and 5-deoxyadenosylcobalamin  Alpha-Lipoic-Acid (ALA) antioxidant that may help with nerve health, pain, and blocking the activation of some inflammatory chemicals Diabetic neuropathy and metabolic syndrome Alpha-lipoic acid (LA), or 1,2-dithiolane-3-pentanoic acid  superoxide dismutase (SOD) Currently being reviewed.  Superoxide dismutase (SOD); Copper-zinc superoxide dismutase  Tiger Balm Currently being reviewed.  Camphor; Menthol; Capsicum Extract (Capsicin); Methyl Salicylate; Essential Oils (Cassia Oil, Cajup[place] Oil, Clove Oil, and Dementholized Mint Oil)    ______________________________________________________________________     ______________________________________________________________________    OTC Supplements:   The following is a list of over-the-counter (OTC) supplements that have been found to have [employer]) studies suggesting that they may be of some benefits when used in moderation in some chronic pain-related conditions.  NOTE:  Always consult with your primary care provider and/or pharmacist before taking any OTC medications to make sure they will not interact with your current medications. Always use manufacturer's recommended dosage.  Supplement Possible benefit May be of benefit in treatment of  Active ingredient(s)  Turmeric/curcumin anti-inflammatory Joint and muscle aches and pain associated with arthritis and inflammation The main active ingredient in turmeric is curcumin.  Glucosamine/chondroitin (triple strength) may slow loss of articular cartilage Osteoarthritis glucosamine HCl and chondroitin sulfate  Vitamin D-3 may suppress release of chemicals associated with inflammation Joint and muscle aches and pain associated with arthritis and inflammation CHOLECALCIFEROL; ALPHA.-TOCOPHEROL, D  Moringa anti-inflammatory with mild analgesic effects Joint and muscle aches and  pain associated with arthritis and inflammation Many:  Phenolic acids: Gallic acid, ellagic acid, ferulic acid, caffeic acid, o-coumaric acid, and chlorogenic acid. Flavonoids: Rutin, quercetin, rhamnetin, kaempferol, apigenin, and myricetin. Terpenes: Lutein, all-E-luteoxanthin, 13-Z-lutein, 15-Z-?-carotene, and all-E-zeaxanthin. Alkaloids: Marumoside A, marumoside B, and pyrrolemarumine-4?-O-?-L-rhamnopyranoside. Vitamins: Vitamin A and vitamin C. Protein: Milk protein  Melatonin Helps reset sleep cycle. Insomnia. May also be helpful in neurodegenerative disorders melatonin, also known as N-acetyl-5-methoxytryptamine  Vitamin B-12 may help keep nerves and blood cells healthy  as well as maintaining function of nervous system Neuropathies. Nerve pain (Burning pain) methylcobalamin and 5-deoxyadenosylcobalamin  Alpha-Lipoic-Acid (ALA) antioxidant that may help with nerve health, pain, and blocking the activation of some inflammatory chemicals Diabetic neuropathy and metabolic syndrome Alpha-lipoic acid (LA), or 1,2-dithiolane-3-pentanoic acid  superoxide dismutase (SOD) Currently being reviewed.  Superoxide dismutase (SOD); Copper-zinc superoxide dismutase  Tiger Balm Currently being reviewed.  Camphor; Menthol; Capsicum Extract (Capsicin); Methyl Salicylate; Essential Oils (Cassia Oil, Cajup[place] Oil, Clove Oil, and Dementholized Mint Oil)    ______________________________________________________________________

## 2023-07-18 DIAGNOSIS — Z139 Encounter for screening, unspecified: Secondary | ICD-10-CM | POA: Insufficient documentation

## 2023-07-18 DIAGNOSIS — Z1211 Encounter for screening for malignant neoplasm of colon: Secondary | ICD-10-CM | POA: Insufficient documentation

## 2023-07-23 ENCOUNTER — Ambulatory Visit: Payer: MEDICAID | Admitting: Pain Medicine

## 2023-07-23 ENCOUNTER — Encounter: Payer: Self-pay | Admitting: Pain Medicine

## 2023-07-23 ENCOUNTER — Ambulatory Visit
Admission: RE | Admit: 2023-07-23 | Discharge: 2023-07-23 | Disposition: A | Discharge status: Home / Self Care | Payer: MEDICAID | Source: Ambulatory Visit | Attending: Pain Medicine | Admitting: Pain Medicine

## 2023-07-23 VITALS — BP 143/75 | HR 77 | Temp 97.0°F | Resp 14 | Ht 72.0 in | Wt 210.0 lb

## 2023-07-23 DIAGNOSIS — E1140 Type 2 diabetes mellitus with diabetic neuropathy, unspecified: Secondary | ICD-10-CM

## 2023-07-23 DIAGNOSIS — G8929 Other chronic pain: Secondary | ICD-10-CM | POA: Diagnosis present

## 2023-07-23 DIAGNOSIS — E1142 Type 2 diabetes mellitus with diabetic polyneuropathy: Secondary | ICD-10-CM

## 2023-07-23 DIAGNOSIS — M542 Cervicalgia: Secondary | ICD-10-CM

## 2023-07-23 DIAGNOSIS — G629 Polyneuropathy, unspecified: Secondary | ICD-10-CM

## 2023-07-23 DIAGNOSIS — M79672 Pain in left foot: Secondary | ICD-10-CM | POA: Diagnosis not present

## 2023-07-23 DIAGNOSIS — G4486 Cervicogenic headache: Secondary | ICD-10-CM | POA: Insufficient documentation

## 2023-07-23 DIAGNOSIS — R519 Headache, unspecified: Secondary | ICD-10-CM

## 2023-07-23 DIAGNOSIS — M79671 Pain in right foot: Secondary | ICD-10-CM

## 2023-07-23 DIAGNOSIS — M79605 Pain in left leg: Secondary | ICD-10-CM | POA: Insufficient documentation

## 2023-07-23 DIAGNOSIS — G63 Polyneuropathy in diseases classified elsewhere: Secondary | ICD-10-CM | POA: Insufficient documentation

## 2023-07-23 DIAGNOSIS — M79604 Pain in right leg: Secondary | ICD-10-CM | POA: Insufficient documentation

## 2023-07-23 MED ORDER — CAPSAICIN-CLEANSING GEL 8 % EX KIT
4.0000 | PACK | Freq: Once | CUTANEOUS | Status: AC
  Administered 2023-07-23: 4 via TOPICAL
  Filled 2023-07-23: qty 4

## 2023-07-23 NOTE — Patient Instructions (Signed)
Capsaicin Patches What is this medication? CAPSAICIN (cap SAY sin) relieves minor pain in your muscles and joints. It works by making your skin feel warm or cool, which blocks pain signals going to the brain. This medicine may be used for other purposes; ask your health care provider or pharmacist if you have questions. COMMON BRAND NAME(S): Qutenza What should I tell my care team before I take this medication? They need to know if you have any of these conditions: Broken or irritated skin High blood pressure History of heart attack or stroke An unusual or allergic reaction to capsaicin, hot peppers, other medications, foods, dyes, or preservatives Pregnant or trying to get pregnant Breast-feeding How should I use this medication? This medication is for external use only. It is applied by your care team in a hospital or clinic setting. Talk to your care team about the use of this medication in children. Special care may be needed. Overdosage: If you think you have taken too much of this medicine contact a poison control center or emergency room at once. NOTE: This medicine is only for you. Do not share this medicine with others. What if I miss a dose? This does not apply. What may interact with this medication? Interactions are not expected. Do not use any other skin products on the affected area without asking your care team. This list may not describe all possible interactions. Give your health care provider a list of all the medicines, herbs, non-prescription drugs, or dietary supplements you use. Also tell them if you smoke, drink alcohol, or use illegal drugs. Some items may interact with your medicine. What should I watch for while using this medication? Your condition will be monitored carefully while you are receiving this medication. Your blood pressure may go up during the procedure. Do not touch the medication patch during treatment. This medication causes red, burning skin. You  may need pain medication for during and after the procedure. This medication can make you more sensitive to heat for a few days after treatment. Be careful in hot showers or baths. Keep out of the sun. Exercise may make the treated skin feel hotter. Tell your care team if your symptoms do not start to get better or if they get worse. What side effects may I notice from receiving this medication? Side effects that you should report to your care team as soon as possible: Allergic reactions--skin rash, itching, hives, swelling of the face, lips, tongue, or throat Side effects that usually do not require medical attention (report these to your care team if they continue or are bothersome): Mild skin irritation, redness, or dryness This list may not describe all possible side effects. Call your doctor for medical advice about side effects. You may report side effects to FDA at 1-800-FDA-1088. Where should I keep my medication? This medication is given in a hospital or clinic. It will not be stored at home. NOTE: This sheet is a summary. It may not cover all possible information. If you have questions about this medicine, talk to your doctor, pharmacist, or health care provider.  2024 Elsevier/Gold Standard (2021-07-11 00:00:00)  

## 2023-07-23 NOTE — Progress Notes (Signed)
PROVIDER NOTE: Interpretation of information contained herein should be left to medically-trained personnel. Specific patient instructions are provided elsewhere under "Patient Instructions" section of medical record. This document was created in part using STT-dictation technology, any transcriptional errors that may result from this process are unintentional.  Patient: Todd Wagner Type: Established DOB: 1960/10/28 MRN: [id number] PCP: Rehabilitation, [facility] Physical Medicine &  Service: Procedure DOS: 07/23/2023 Setting: Ambulatory Location: Ambulatory outpatient facility Delivery: Face-to-face Provider: Oswaldo Done, MD Specialty: Interventional Pain Management Specialty designation: 09 Location: Outpatient facility Ref. Prov.: [facility]       Interventional Therapy   Type: Qutenza Neurolysis #1  Laterality:  Bilateral Area treated: Feet Imaging Guidance: None Anesthesia/analgesia/anxiolysis/sedation: None required Medication (Right): Qutenza (capsaicin 8%) topical system Medication (Left): Qutenza (capsaicin 8%) topical system Date: 07/23/2023 Performed by: Oswaldo Done, MD Rationale (medical necessity): procedure needed and proper for the treatment of Todd Wagner medical symptoms and needs. 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 1. Chronic feet pain (5th area of Pain) (Bilateral) (R=L)   2. Chronic lower extremity pain (4th area of Pain) (Bilateral) (R=L)   3. Chronic painful diabetic neuropathy (HCC)   4. Diabetic sensorimotor polyneuropathy (HCC)   5. Generalized polyneuropathy   6. Polyneuropathy associated with underlying disease (HCC)    NAS-11 Pain score:   Pre-procedure: 7 /10   Post-procedure: 7 /10     Position / Prep / Materials:  Position: Supine  Materials: Qutenza Kit  H&P (Pre-op Assessment):  Todd Wagner is a 62 y.o.  (year old), male patient, seen today for interventional treatment. He  has a past surgical history that includes Appendectomy; Coronary angioplasty with stent; and LEFT HEART CATH AND CORONARY ANGIOGRAPHY (N/A, 05/23/2017). Todd Wagner has a current medication list which includes the following prescription(s): acetaminophen, amitriptyline, aspirin, atorvastatin, accu-chek guide me, colchicine, dexcom g7 sensor, diclofenac sodium, diphenhydramine, gabapentin, glipizide, hydrocodone-acetaminophen, insulin aspart protamine - aspart, insulin lispro, lantus solostar, melatonin, meloxicam, metformin, metoprolol tartrate, mirtazapine, nitroglycerin, omeprazole, ondansetron, ozempic (0.25 or 0.5 mg/dose), trazodone, ventolin hfa, and lisinopril. His primarily concern today is the Foot Pain (bilateral)  Initial Vital Signs:  Pulse/HCG Rate: 77  Temp: (!) 97 F (36.1 C) Resp: 14 BP: (!) 143/75 SpO2: 100 %  BMI: Estimated body mass index is 28.48 kg/m as calculated from the following:   Height as of this encounter: 6' (1.829 m).   Weight as of this encounter: 210 lb (95.3 kg).  Risk Assessment: Allergies: Reviewed. He has No Known Allergies.  Allergy Precautions: None required Coagulopathies: Reviewed. None identified.  Blood-thinner therapy: None at this time Active Infection(s): Reviewed. None identified. Todd Wagner is afebrile  Note: The patient comes into the clinic today with swelling in both hands (R>L).  He describes having had this problem since the carpal tunnel release.  In addition, today he indicates having neck pain and occipital headaches and would like for [place] to evaluate this as well.  He describes the pain as constant but worsening whenever he tries to lay down in bed.  He also describes this pain as being bilateral.  He is having bilateral upper extremity pain which was originally thought to be secondary to his carpal tunnel but he also has painful diabetic neuropathy, both of which may  create problems and further evaluating any possible cervical radiculitis/radiculopathy.  Today we will be ordering x-rays of the cervical spine to evaluate the area.  Site Confirmation: [name] was asked to confirm the procedure and laterality before marking the site Procedure checklist: Completed Consent: Before the procedure and under the influence of no sedative(s), amnesic(s), or anxiolytics, the patient was informed of the treatment options, risks and possible complications. To fulfill our ethical and legal obligations, as recommended by the American Medical Association's Code of Ethics, I have informed the patient of my clinical impression; the nature and purpose of the treatment or procedure; the risks, benefits, and possible complications of the intervention; the alternatives, including doing nothing; the risk(s) and benefit(s) of the alternative treatment(s) or procedure(s); and the risk(s) and benefit(s) of doing nothing. The patient was provided information about the general risks and possible complications associated with the procedure. These may include, but are not limited to: failure to achieve desired goals, infection, bleeding, organ or nerve damage, allergic reactions, paralysis, and death. In addition, the patient was informed of those risks and complications associated to the procedure, such as failure to decrease pain; infection; bleeding; organ or nerve damage with subsequent damage to sensory, motor, and/or autonomic systems, resulting in permanent pain, numbness, and/or weakness of one or several areas of the body; allergic reactions; (i.e.: anaphylactic reaction); and/or death. Furthermore, the patient was informed of those risks and complications associated with the medications. These include, but are not limited to: allergic reactions (i.e.: anaphylactic or anaphylactoid reaction(s)); adrenal axis suppression; blood sugar elevation that in diabetics may result in ketoacidosis or  comma; water retention that in patients with history of congestive heart failure may result in shortness of breath, pulmonary edema, and decompensation with resultant heart failure; weight gain; swelling or edema; medication-induced neural toxicity; particulate matter embolism and blood vessel occlusion with resultant organ, and/or nervous system infarction; and/or aseptic necrosis of one or more joints. Finally, the patient was informed that Medicine is not an exact science; therefore, there is also the possibility of unforeseen or unpredictable risks and/or possible complications that may result in a catastrophic outcome. The patient indicated having understood very clearly. We have given the patient no guarantees and we have made no promises. Enough time was given to the patient to ask questions, all of which were answered to the patient's satisfaction. Todd Wagner has indicated that he wanted to continue with the procedure. Attestation: I, the ordering provider, attest that I have discussed with the patient the benefits, risks, side-effects, alternatives, likelihood of achieving goals, and potential problems during recovery for the procedure that I have provided informed consent. Date  Time: 07/23/2023 10:40 AM  Pre-Procedure Preparation:  Monitoring: As per clinic protocol. Respiration, ETCO2, SpO2, BP, heart rate and rhythm monitor placed and checked for adequate function Safety Precautions: Patient was assessed for positional comfort and pressure points before starting the procedure. Time-out: I initiated and conducted the "Time-out" before starting the procedure, as per protocol. The patient was asked to participate by confirming the accuracy of the "Time Out" information. Verification of the correct person, site, and procedure were performed and confirmed by me, the nursing staff, and the patient. "Time-out" conducted as per Joint Commission's Universal Protocol (UP.01.01.01). Time: 1045 Start  Time: 1050 hrs.  Description/Narrative of Procedure:          Region: Distal lower extremity Target Area: Sensory peripheral nerves affected by diabetic peripheral neuropathy Site: Feet Approach: Percutaneous  No./Series: Not applicable  Type: Percutaneous  Purpose: Therapeutic  Region: Distal lower extremities  Start Time: 1050 hrs.  Description of the Procedure: Protocol guidelines were followed. The patient was assisted  into a comfortable position.  Informed consent was obtained in the patient monitored in the usual manner.  All questions were answered prior to the procedure.  They Qutenza patches were applied to the affected area and then covered with the wrap.  The Patient was kept under observation until the treatment was completed.  The patches were removed and the treated area was inspected.  Vitals:   07/23/23 1040  BP: (!) 143/75  Pulse: 77  Resp: 14  Temp: (!) 97 F (36.1 C)  TempSrc: Temporal  SpO2: 100%  Weight: 210 lb (95.3 kg)  Height: 6' (1.829 m)     End Time:   hrs.  Type of Imaging Technique: None used Indication(s): N/A Exposure Time: No patient exposure Contrast: None used. Fluoroscopic Guidance: N/A Ultrasound Guidance: N/A Interpretation: N/A  Post-operative Assessment:  Post-procedure Vital Signs:  Pulse/HCG Rate: 77  Temp: (!) 97 F (36.1 C) Resp: 14 BP: (!) 143/75 SpO2: 100 %  EBL: None  Complications: No immediate post-treatment complications observed by team, or reported by patient.  Note: The patient tolerated the entire procedure well. A repeat set of vitals were taken after the procedure and the patient was kept under observation following institutional policy, for this type of procedure. Post-procedural neurological assessment was performed, showing return to baseline, prior to discharge. The patient was provided with post-procedure discharge instructions, including a section on how to identify potential problems. Should any problems  arise concerning this procedure, the patient was given instructions to immediately contact us, at any time, without hesitation. In any case, we plan to contact the patient by telephone for a follow-up status report regarding this interventional procedure.  Comments:  No additional relevant information.  Plan of Care (POC)  Orders:  Orders Placed This Encounter  Procedures   NEUROLYSIS    Order Specific Question:   Where will this procedure be performed?    Answer:   [facility]   DG Cervical Spine With Flex & Extend    Patient presents with axial pain with possible radicular component.  Please evaluate for any evidence of cervical spine instability. Describe the presence of any spondylolisthesis (Antero- or retrolisthesis). If present, provide displacement "Grade" and measurement in cm. Please describe presence and specific location (Level & Laterality) of any signs of  osteoarthritis, zygapophyseal (Facet) joints DJD (including decreased joint space and/or osteophytosis), DDD, Foraminal narrowing, as well as any sclerosis and/or cyst formation. Please comment on ROM. Patient presents with axial pain with possible radicular component. Please assist [place] in identifying specific level(s) and laterality of any additional findings such as: 1. Facet (Zygapophyseal) joint DJD (Hypertrophy, space narrowing, subchondral sclerosis, and/or osteophyte formation) 2. DDD and/or IVDD (Loss of disc height, desiccation, gas patterns, osteophytes, endplate sclerosis, or "Black disc disease") 3. Pars defects 4. Spondylolisthesis, spondylosis, and/or spondyloarthropathies (include Degree/Grade of displacement in mm) (stability) 5. Vertebral body Fractures (acute/chronic) (state percentage of collapse) 6. Demineralization (osteopenia/osteoporotic) 7. Bone pathology 8. Foraminal narrowing  9. Surgical changes    Standing Status:   Future    Standing Expiration Date:   10/23/2023    Scheduling  Instructions:     Please make sure that the patient understands that this needs to be done as soon as possible. Never have the patient do the imaging "just before the next appointment". Inform patient that having the imaging done within the [facility] will expedite the availability of the results and will provide      imaging availability to the  requesting physician. In addition inform the patient that the imaging order has an expiration date and will not be renewed if not done within the active period.    Order Specific Question:   Reason for Exam (SYMPTOM  OR DIAGNOSIS REQUIRED)    Answer:   Cervicalgia, chronic neck pain    Order Specific Question:   Preferred imaging location?    Answer:   [facility]    Order Specific Question:   Call Results- Best Contact Number?    Answer:   [phone] [place] [facility]    Order Specific Question:   Radiology Contrast Protocol - do NOT remove file path    Answer:   [email or web address]    Order Specific Question:   Release to patient    Answer:   Immediate   Informed Consent Details: Physician/Practitioner Attestation; Transcribe to consent form and obtain patient signature    Nursing Order: Transcribe to consent form and obtain patient signature.    Order Specific Question:   Physician/Practitioner attestation of informed consent for procedure/surgical case    Answer:   I, the physician/practitioner, attest that I have discussed with the patient the benefits, risks, side effects, alternatives, likelihood of achieving goals and potential problems during recovery for the procedure that I have provided informed consent.    Order Specific Question:   Procedure    Answer:   Qutenza peripheral neurolysis    Order Specific Question:   Physician/Practitioner performing the procedure    Answer:   Delano Metz, MD    Order Specific Question:   Indication/Reason     Answer:   Painful Diabetic Peripheral Neuropathy   Chronic Opioid Analgesic:  No chronic opioid analgesics therapy prescribed by our practice. None MME/day: 0 mg/day   Medications ordered for procedure: Meds ordered this encounter  Medications   capsaicin topical system 8 % patch 4 patch    Disregard order if transmitted to pharmacy. Qutenza Kit to be used from [facility].   Medications administered: We administered capsaicin topical system.  See the medical record for exact dosing, route, and time of administration.  Follow-up plan:   Return in about 2 weeks (around 08/06/2023) for (Face2F), (PPE), for review of ordered tests.       Interventional Therapies  Risk Factors  Considerations:  Polysubstance abuse  homicidal ideations  Hx. ETOH abuse  Hx.  Marijuana abuse  T2IDDM  CAD  HTN  GERD  Gastroparesis  Angina Pectoris    Planned  Pending:      Under consideration:   Therapeutic bilateral lower extremity Qutenza treatment #2  Diagnostic/therapeutic bilateral scaphoid-radial joint (small joint) injection #1  Diagnostic/therapeutic bilateral L3-4 (L3NR) TFESI #1  Diagnostic/therapeutic bilateral L4-5 (L4NR) TFESI #1  Diagnostic/therapeutic bilateral L5-S1 (L5NR) TFESI #1  Therapeutic bilateral L2-3, L3-4, and L4-5 MILD procedure #1  Diagnostic/therapeutic bilateral L3-4, L4-5 lumbar facet MBB #1  Diagnostic/therapeutic left knee genicular nerve block #1    Completed:   Therapeutic bilateral lower extremity Qutenza treatment x1 (07/23/2023) Referral to hand surgeon for bilateral carpal tunnel release (04/17/2023)    Therapeutic  Palliative (PRN) options:   None established   Completed by other providers:   (05/31/2023) right carpal tunnel and right ring trigger finger release by Evalee Jefferson, MD ([facility]) (03/11/2023) UE EMG/PNCV: Conclusions: This is an abnormal study. These electrodiagnostic findings show evidence of bilateral,   (L>R), severe median neuropathies at the wrists. There is also evidence  of a left ulnar neuropathy at the elbow. Electrodiagnostic findings in the arms are suggestive of a generalized polyneuropathy, as can be seen with diabetes. By Elisabeth Most, MD [facility])       Recent Visits Date Type Provider Dept  07/10/23 Office Visit Delano Metz, MD [facility]  06/19/23 Office Visit Delano Metz, MD [facility]  Showing recent visits within past 90 days and meeting all other requirements Today's Visits Date Type Provider Dept  07/23/23 Procedure visit Delano Metz, MD [facility]  Showing today's visits and meeting all other requirements Future Appointments No visits were found meeting these conditions. Showing future appointments within next 90 days and meeting all other requirements  Disposition: Discharge home  Discharge (Date  Time): 07/23/2023;   hrs.   Primary Care Physician: [facility] Physical Medicine & Location: [facility] Note by: Oswaldo Done, MD (TTS technology used. I apologize for any typographical errors that were not detected and corrected.) Date: 07/23/2023; Time: 11:29 AM  Disclaimer:  Medicine is not an Visual merchandiser. The only guarantee in medicine is that nothing is guaranteed. It is important to note that the decision to proceed with this intervention was based on the information collected from the patient. The Data and conclusions were drawn from the patient's questionnaire, the interview, and the physical examination. Because the information was provided in large part by the patient, it cannot be guaranteed that it has not been purposely or unconsciously manipulated. Every effort has been made to obtain as much relevant data as possible for this evaluation. It is important to note that the conclusions that lead to this procedure are derived in  large part from the available data. Always take into account that the treatment will also be dependent on availability of resources and existing treatment guidelines, considered by other Pain Management Practitioners as being common knowledge and practice, at the time of the intervention. For Medico-Legal purposes, it is also important to point out that variation in procedural techniques and pharmacological choices are the acceptable norm. The indications, contraindications, technique, and results of the above procedure should only be interpreted and judged by a Board-Certified Interventional Pain Specialist with extensive familiarity and expertise in the same exact procedure and technique.

## 2023-07-24 ENCOUNTER — Telehealth: Payer: Self-pay

## 2023-07-24 NOTE — Telephone Encounter (Signed)
Post procedure follow up..  Patient states he is doing good 

## 2023-08-04 DIAGNOSIS — N183 Chronic kidney disease, stage 3 unspecified: Secondary | ICD-10-CM | POA: Insufficient documentation

## 2023-08-04 NOTE — Progress Notes (Unsigned)
PROVIDER NOTE: Information contained herein reflects review and annotations entered in association with encounter. Interpretation of such information and data should be left to medically-trained personnel. Information provided to patient can be located elsewhere in the medical record under "Patient Instructions". Document created using STT-dictation technology, any transcriptional errors that may result from process are unintentional.    Patient: Todd Wagner  Service Category: E/M  Provider: Oswaldo Done, MD  DOB: March 19, 1961  DOS: 08/06/2023  Referring Provider: [facility]  MRN: [id number]  Specialty: Interventional Pain Management  PCP: [facility]  Type: Established Patient  Setting: Ambulatory outpatient    Location: Office  Delivery: Face-to-face     HPI  Mr. Bless Canino, a 62 y.o. year old male, is here today because of his Chronic pain of both feet [M79.671, G89.29, M79.672]. Mr. Braganza primary complain today is No chief complaint on file.  Pertinent problems: Mr. 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, L4-5, L5-S1) (Severe: L4-5); Lumbar central spinal stenosis w/o claudication; Old tear of medial meniscus of knee (Left); Osteoarthritis of hands (Bilateral); Chronic hand pain (1ry area of Pain) (Bilateral); Osteoarthritis of wrists (Bilateral); Low back pain of over 3 months duration; Chronic knee pain (3ry  area of Pain) (Left); Chronic low back pain (2ry area of Pain) (Midline) w/o sciatica; Lumbar facet joint pain; Chronic feet pain (5th area of Pain) (Bilateral) (R=L); Chronic lower extremity pain (4th area of Pain) (Bilateral) (R=L); Abnormal MRI, lumbar spine (01/26/2023); Carpal tunnel syndrome (Bilateral) (L>R); Ulnar neuropathy of upper extremity (Left); Generalized polyneuropathy; Hypertrophy of ligamentum flavum (L2-3, L3-4, L4-5); Abnormal NCS (UE) (nerve conduction studies) (03/11/2023); Traumatic osteoarthritis of wrist (Left); Arthropathy of wrist (Left); Chronic painful diabetic neuropathy (HCC); Neuropathy, median nerve (Bilateral) (L>R); Cubital tunnel syndrome (Left); Acute pain of right shoulder; Calcific tendinitis of shoulder; Medial epicondylitis; Ring finger, trigger finger (Right); Carpal tunnel syndrome (Right); Polyarthralgia; History of carpal tunnel release (05/31/2023) (Right); S/P ring finger trigger finger release (05/31/2023) (Right); Non-articular hand pain (Right); S/P carpal tunnel release (05/31/2023) (Right); Diabetic sensorimotor polyneuropathy (HCC); Chronic neck pain (Bilateral); and Cervicogenic headache (Bilateral) on their pertinent problem list. Pain Assessment: Severity of   is reported as a  /10. Location:    / . Onset:  . Quality:  . Timing:  . Modifying factor(s):  Marland Kitchen Vitals:  vitals were not taken for this visit.  BMI: Estimated body mass index is 28.48 kg/m as calculated from the following:   Height as of 07/23/23: 6' (1.829 m).   Weight as of 07/23/23: 210 lb (95.3 kg). Last encounter: 07/10/2023. Last procedure: 07/23/2023.  Reason for encounter: post-procedure evaluation and assessment. ***  Post-procedure evaluation   Type: Qutenza Neurolysis #1  Laterality:  Bilateral Area treated: Feet Imaging Guidance: None Anesthesia/analgesia/anxiolysis/sedation: None required Medication (Right): Qutenza (capsaicin 8%) topical system Medication (Left): Qutenza  (capsaicin 8%) topical system Date: 07/23/2023 Performed by: Oswaldo Done, MD Rationale (medical necessity): procedure needed and proper for the treatment of Mr. Delashmit medical symptoms and needs. Indication: Painful diabetic peripheral neuralgia (DPN) (ICD-10-CM:E11.40) severe enough to impact quality of  life or function. 1. Chronic feet pain (5th area of Pain) (Bilateral) (R=L)   2. Chronic lower extremity pain (4th area of Pain) (Bilateral) (R=L)   3. Chronic painful diabetic neuropathy (HCC)   4. Diabetic sensorimotor polyneuropathy (HCC)   5. Generalized polyneuropathy   6. Polyneuropathy associated with underlying disease (HCC)    NAS-11 Pain score:   Pre-procedure: 7 /10   Post-procedure: 7 /10      Effectiveness:  Initial hour after procedure:   ***. Subsequent 4-6 hours post-procedure:   ***. Analgesia past initial 6 hours:   ***. Ongoing improvement:  Analgesic:  *** Function:    ***    ROM:    ***     Pharmacotherapy Assessment  Analgesic: No chronic opioid analgesics therapy prescribed by our practice. None MME/day: 0 mg/day   Monitoring: [place] PMP: PDMP reviewed during this encounter.       Pharmacotherapy: No side-effects or adverse reactions reported. Compliance: No problems identified. Effectiveness: Clinically acceptable.  No notes on file  No results found for: "CBDTHCR" No results found for: "D8THCCBX" No results found for: "D9THCCBX"  UDS:  Summary  Date Value Ref Range Status  03/11/2023 Note  Final    Comment:    ==================================================================== Compliance Drug Analysis, Ur ==================================================================== Test                             Result       Flag       Units  Drug Present and Declared for Prescription Verification   Gabapentin                     PRESENT      EXPECTED   Amitriptyline                  PRESENT      EXPECTED   Mirtazapine                     PRESENT      EXPECTED   Trazodone                      PRESENT      EXPECTED   1,3 chlorophenyl piperazine    PRESENT      EXPECTED    1,3-chlorophenyl piperazine is an expected metabolite of trazodone.    Metoprolol                     PRESENT      EXPECTED  Drug Absent but Declared for Prescription Verification   Hydrocodone                    Not Detected UNEXPECTED ng/mg creat   Acetaminophen                  Not Detected UNEXPECTED    Acetaminophen, as indicated in the declared medication list, is not    always detected even when used as directed.    Salicylate                     Not Detected UNEXPECTED    Aspirin, as indicated in the declared medication list, is not always    detected even when used as directed.    Diphenhydramine                Not Detected UNEXPECTED ====================================================================  Test                      Result    Flag   Units      Ref Range   Creatinine              67               mg/dL      >=09 ==================================================================== Declared Medications:  The flagging and interpretation on this report are based on the  following declared medications.  Unexpected results may arise from  inaccuracies in the declared medications.   **Note: The testing scope of this panel includes these medications:   Amitriptyline (Elavil)  Diphenhydramine (Sominex)  Gabapentin (Neurontin)  Hydrocodone (Norco)  Metoprolol (Lopressor)  Mirtazapine (Remeron)  Trazodone (Desyrel)   **Note: The testing scope of this panel does not include small to  moderate amounts of these reported medications:   Acetaminophen (Tylenol)  Acetaminophen (Norco)  Aspirin   **Note: The testing scope of this panel does not include the  following reported medications:   Albuterol (Ventolin HFA)  Atorvastatin (Lipitor)  Colchicine  Glipizide (Glucotrol)  Insulin (NovoLog)  Lisinopril (Zestril)  Melatonin   Meloxicam (Mobic)  Metformin (Glucophage)  Nitroglycerin (Nitrostat)  Omeprazole (Prilosec)  Ondansetron (Zofran) ==================================================================== For clinical consultation, please call [phone]. ====================================================================       ROS  Constitutional: Denies any fever or chills Gastrointestinal: No reported hemesis, hematochezia, vomiting, or acute GI distress Musculoskeletal: Denies any acute onset joint swelling, redness, loss of ROM, or weakness Neurological: No reported episodes of acute onset apraxia, aphasia, dysarthria, agnosia, amnesia, paralysis, loss of coordination, or loss of consciousness  Medication Review  Accu-Chek Guide Me, Dexcom G7 Sensor, HYDROcodone-acetaminophen, Melatonin, Semaglutide(0.25 or 0.5MG /DOS), acetaminophen, albuterol, amitriptyline, aspirin, atorvastatin, colchicine, diclofenac Sodium, diphenhydrAMINE, gabapentin, glipiZIDE, insulin aspart protamine - aspart, insulin glargine, insulin lispro, lisinopril, meloxicam, metFORMIN, metoprolol tartrate, mirtazapine, nitroGLYCERIN, omeprazole, ondansetron, and traZODone  History Review  Allergy: Mr. Achille has No Known Allergies. Drug: Mr. Silvey  reports current drug use. Drug: Marijuana. Alcohol:  reports current alcohol use. Tobacco:  reports that he has never smoked. He has never used smokeless tobacco. Social: Mr. Ranft  reports that he has never smoked. He has never used smokeless tobacco. He reports current alcohol use. He reports current drug use. Drug: Marijuana. Medical:  has a past medical history of Diabetes mellitus without complication (HCC) and Hypertension. Surgical: Mr. Burson  has a past surgical history that includes Appendectomy; Coronary angioplasty with stent; and LEFT HEART CATH AND CORONARY ANGIOGRAPHY (N/A, 05/23/2017). Family: family history is not on file.  Laboratory Chemistry Profile   Renal Lab  Results  Component Value Date   BUN 22 03/11/2023   CREATININE 1.58 (H) 03/11/2023   BCR 14 03/11/2023   GFRAA >60 06/16/2017   GFRNONAA >60 06/16/2017    Hepatic Lab Results  Component Value Date   AST 14 03/11/2023   ALT 29 06/16/2017   ALBUMIN 4.5 03/11/2023   ALKPHOS 144 (H) 03/11/2023   LIPASE 18 06/16/2017    Electrolytes Lab Results  Component Value Date   NA 133 (L) 03/11/2023   K CANCELED 03/11/2023   CL 101 03/11/2023   CALCIUM 9.3 03/11/2023   MG 1.7 03/11/2023    Bone Lab Results  Component Value Date   25OHVITD1 27 (L) 03/11/2023   25OHVITD2 7.5 03/11/2023   25OHVITD3 19 03/11/2023    Inflammation (CRP: Acute Phase) (ESR: Chronic Phase)  Lab Results  Component Value Date   CRP 2 03/11/2023   ESRSEDRATE 16 03/11/2023         Note: Above Lab results reviewed.  Recent Imaging Review  DG Lumbar Spine Complete W/Bend CLINICAL DATA:  Low back pain for 3 months without sciatica.  EXAM: LUMBAR SPINE - COMPLETE WITH BENDING VIEWS  COMPARISON:  CT abdomen and pelvis, 7252  FINDINGS: No fracture.  No bone lesion.  Grade 1 anterolisthesis of L4 on L5. Mild loss of disc height at L4-L5. No other malalignment. Remaining disc spaces are well preserved. Mild facet degenerative change at L4-L5.  Generalized diffuse increase in bowel gas without bowel dilation.  IMPRESSION: 1. No fracture or acute finding. 2. Degenerative changes at L4-L5 with a grade 1 anterolisthesis. Degenerative changes are similar to the prior CT. Anterolisthesis was not present on the prior CT.  Electronically Signed   By: Amie Portland M.D.   On: 03/15/2023 14:29 DG Hand Complete Right CLINICAL DATA:  Chronic bilateral hand pain.  EXAM: RIGHT HAND - COMPLETE 3+ VIEW  COMPARISON:  Right wrist radiographs, 07/28/2013.  FINDINGS: No fracture.  No bone lesion.  Joint space narrowing, mild subchondral sclerosis and cystic change and marginal osteophytes noted at fourth  finger DIP joint.  Joint space narrowing with mild subchondral sclerosis and small marginal osteophytes noted at the radial scaphoid articulation. Remaining joints normally spaced and aligned  Small cysts noted, base of the second metacarpal and in the distal triquetrum.  Soft tissues are unremarkable.  IMPRESSION: 1. No fracture or acute finding. 2. Degenerative/arthropathic changes at the radial scaphoid articulation similar to the prior wrist radiographs. 3. Moderate osteoarthritis involving the fourth finger DIP joint.  Electronically Signed   By: Amie Portland M.D.   On: 03/15/2023 14:26 DG Hand Complete Left CLINICAL DATA:  Chronic bilateral hand pain.  EXAM: LEFT HAND - COMPLETE 3+ VIEW  COMPARISON:  Left wrist radiographs, 07/24/2019.  FINDINGS: No fracture or acute finding.  No bone lesion.  Marked narrowing of the radial scaphoid joint with subchondral cystic change mild sclerosis and a bony prominence from the radial margin the scaphoid stable compared to the prior exam.  Remaining joints are normally spaced and aligned.  Soft tissues are unremarkable.  IMPRESSION: 1. No fracture or acute finding. 2. Stable arthropathic changes at the radial scaphoid articulation, which may reflect posttraumatic osteoarthritis. 3. No other arthropathic changes.  Electronically Signed   By: Amie Portland M.D.   On: 03/15/2023 14:24 Note: Reviewed        Physical Exam  General appearance: Well nourished, well developed, and well hydrated. In no apparent acute distress Mental status: Alert, oriented x 3 (person, place, & time)       Respiratory: No evidence of acute respiratory distress Eyes: PERLA Vitals: There were no vitals taken for this visit. BMI: Estimated body mass index is 28.48 kg/m as calculated from the following:   Height as of 07/23/23: 6' (1.829 m).   Weight as of 07/23/23: 210 lb (95.3 kg). Ideal: Ideal body weight: 77.6 kg (171 lb 1.2 oz) Adjusted  ideal body weight: 84.7 kg (186 lb 10.3 oz)  Assessment   Diagnosis Status  1. Chronic feet pain (5th area of Pain) (Bilateral) (R=L)   2. Chronic lower extremity pain (4th area of Pain) (Bilateral) (R=L)   3. Chronic painful diabetic neuropathy (HCC)   4. Diabetic sensorimotor polyneuropathy (HCC)   5. Generalized polyneuropathy   6. Postop check  Controlled Controlled Controlled   Updated Problems: No problems updated.  Plan of Care  Problem-specific:  No problem-specific Assessment & Plan notes found for this encounter.  Mr. Brain Agosta has a current medication list which includes the following long-term medication(s): amitriptyline, atorvastatin, colchicine, diphenhydramine, gabapentin, glipizide, insulin aspart protamine - aspart, insulin lispro, lisinopril, metoprolol tartrate, mirtazapine, nitroglycerin, omeprazole, trazodone, and ventolin hfa.  Pharmacotherapy (Medications Ordered): No orders of the defined types were placed in this encounter.  Orders:  No orders of the defined types were placed in this encounter.  Follow-up plan:   No follow-ups on file.      Interventional Therapies  Risk Factors  Considerations:  Polysubstance abuse  homicidal ideations  Hx. ETOH abuse  Hx.  Marijuana abuse  T2IDDM  CAD  HTN  GERD  Gastroparesis  Angina Pectoris    Planned  Pending:      Under consideration:   Therapeutic bilateral lower extremity Qutenza treatment #2  Diagnostic/therapeutic bilateral scaphoid-radial joint (small joint) injection #1  Diagnostic/therapeutic bilateral L3-4 (L3NR) TFESI #1  Diagnostic/therapeutic bilateral L4-5 (L4NR) TFESI #1  Diagnostic/therapeutic bilateral L5-S1 (L5NR) TFESI #1  Therapeutic bilateral L2-3, L3-4, and L4-5 MILD procedure #1  Diagnostic/therapeutic bilateral L3-4, L4-5 lumbar facet MBB #1  Diagnostic/therapeutic left knee genicular nerve block #1    Completed:   Therapeutic bilateral lower extremity Qutenza  treatment x1 (07/23/2023) Referral to hand surgeon for bilateral carpal tunnel release (04/17/2023)    Therapeutic  Palliative (PRN) options:   None established   Completed by other providers:   (05/31/2023) right carpal tunnel and right ring trigger finger release by Evalee Jefferson, MD ([facility]) (03/11/2023) UE EMG/PNCV: Conclusions: This is an abnormal study. These electrodiagnostic findings show evidence of bilateral,  (L>R), severe median neuropathies at the wrists. There is also evidence of a left ulnar neuropathy at the elbow. Electrodiagnostic findings in the arms are suggestive of a generalized polyneuropathy, as can be seen with diabetes. By Elisabeth Most, MD [facility])        Recent Visits Date Type Provider Dept  07/23/23 Procedure visit Delano Metz, MD [facility]  07/10/23 Office Visit Delano Metz, MD [facility]  06/19/23 Office Visit Delano Metz, MD [facility]  Showing recent visits within past 90 days and meeting all other requirements Future Appointments Date Type Provider Dept  08/06/23 Appointment Delano Metz, MD [facility]  Showing future appointments within next 90 days and meeting all other requirements  I discussed the assessment and treatment plan with the patient. The patient was provided an opportunity to ask questions and all were answered. The patient agreed with the plan and demonstrated an understanding of the instructions.  Patient advised to call back or seek an in-person evaluation if the symptoms or condition worsens.  Duration of encounter: *** minutes.  Total time on encounter, as per AMA guidelines included both the face-to-face and non-face-to-face time personally spent by the physician and/or other qualified health care professional(s) on the day of the encounter (includes time in activities that require the physician or other qualified health care  professional and does not include time in activities normally performed by clinical staff). Physician's time may include the following activities when performed: Preparing to see the patient (e.g., pre-charting review of records, searching for previously ordered imaging, lab work, and nerve conduction tests) Review of prior analgesic pharmacotherapies. Reviewing PMP Interpreting ordered tests (e.g., lab work, imaging, nerve conduction tests) Performing post-procedure evaluations, including interpretation  of diagnostic procedures Obtaining and/or reviewing separately obtained history Performing a medically appropriate examination and/or evaluation Counseling and educating the patient/family/caregiver Ordering medications, tests, or procedures Referring and communicating with other health care professionals (when not separately reported) Documenting clinical information in the electronic or other health record Independently interpreting results (not separately reported) and communicating results to the patient/ family/caregiver Care coordination (not separately reported)  Note by: Oswaldo Done, MD Date: 08/06/2023; Time: 3:57 PM

## 2023-08-06 ENCOUNTER — Ambulatory Visit: Payer: MEDICAID | Attending: Pain Medicine | Admitting: Pain Medicine

## 2023-08-06 ENCOUNTER — Encounter: Payer: Self-pay | Admitting: Pain Medicine

## 2023-08-06 VITALS — BP 139/82 | HR 78 | Temp 98.0°F | Resp 14 | Ht 72.0 in | Wt 215.0 lb

## 2023-08-06 DIAGNOSIS — E1142 Type 2 diabetes mellitus with diabetic polyneuropathy: Secondary | ICD-10-CM | POA: Diagnosis present

## 2023-08-06 DIAGNOSIS — G629 Polyneuropathy, unspecified: Secondary | ICD-10-CM | POA: Insufficient documentation

## 2023-08-06 DIAGNOSIS — M4312 Spondylolisthesis, cervical region: Secondary | ICD-10-CM | POA: Insufficient documentation

## 2023-08-06 DIAGNOSIS — M4802 Spinal stenosis, cervical region: Secondary | ICD-10-CM | POA: Diagnosis present

## 2023-08-06 DIAGNOSIS — G4486 Cervicogenic headache: Secondary | ICD-10-CM | POA: Insufficient documentation

## 2023-08-06 DIAGNOSIS — M79671 Pain in right foot: Secondary | ICD-10-CM | POA: Diagnosis present

## 2023-08-06 DIAGNOSIS — M79605 Pain in left leg: Secondary | ICD-10-CM | POA: Diagnosis present

## 2023-08-06 DIAGNOSIS — M5412 Radiculopathy, cervical region: Secondary | ICD-10-CM | POA: Diagnosis present

## 2023-08-06 DIAGNOSIS — Z794 Long term (current) use of insulin: Secondary | ICD-10-CM

## 2023-08-06 DIAGNOSIS — M542 Cervicalgia: Secondary | ICD-10-CM | POA: Insufficient documentation

## 2023-08-06 DIAGNOSIS — E1140 Type 2 diabetes mellitus with diabetic neuropathy, unspecified: Secondary | ICD-10-CM | POA: Insufficient documentation

## 2023-08-06 DIAGNOSIS — Z09 Encounter for follow-up examination after completed treatment for conditions other than malignant neoplasm: Secondary | ICD-10-CM | POA: Diagnosis present

## 2023-08-06 DIAGNOSIS — M79672 Pain in left foot: Secondary | ICD-10-CM | POA: Insufficient documentation

## 2023-08-06 DIAGNOSIS — M79604 Pain in right leg: Secondary | ICD-10-CM | POA: Diagnosis not present

## 2023-08-06 DIAGNOSIS — G8929 Other chronic pain: Secondary | ICD-10-CM | POA: Diagnosis present

## 2023-08-06 NOTE — Patient Instructions (Addendum)
 ______________________________________________________________________    Preparing for your procedure  Appointments: If you think you may not be able to keep your appointment, call 24-48 hours in advance to cancel. We need time to make it available to others.  During your procedure appointment there will be: No Prescription Refills. No disability issues to discussed. No medication changes or discussions.  Instructions: Food intake: Avoid eating anything solid for at least 8 hours prior to your procedure. Clear liquid intake: You may take clear liquids such as water up to 2 hours prior to your procedure. (No carbonated drinks. No soda.) Transportation: Unless otherwise stated by your physician, bring a driver. (Driver cannot be a Market researcher, Pharmacist, community, or any other form of public transportation.) Morning Medicines: Except for blood thinners, take all of your other morning medications with a sip of water. Make sure to take your heart and blood pressure medicines. If your blood pressure's lower number is above 100, the case will be rescheduled. Blood thinners: Make sure to stop your blood thinners as instructed.  If you take a blood thinner, but were not instructed to stop it, call our office [phone] and ask to talk to a nurse. Not stopping a blood thinner prior to certain procedures could lead to serious complications. Diabetics on insulin: Notify the staff so that you can be scheduled 1st case in the morning. If your diabetes requires high dose insulin, take only  of your normal insulin dose the morning of the procedure and notify the staff that you have done so. Preventing infections: Shower with an antibacterial soap the morning of your procedure.  Build-up your immune system: Take 1000 mg of Vitamin C with every meal (3 times a day) the day prior to your procedure. Antibiotics: Inform the nursing staff if you are taking any antibiotics or if you have any conditions that may require antibiotics  prior to procedures. (Example: recent joint implants)   Pregnancy: If you are pregnant make sure to notify the nursing staff. Not doing so may result in injury to the fetus, including death.  Sickness: If you have a cold, fever, or any active infections, call and cancel or reschedule your procedure. Receiving steroids while having an infection may result in complications. Arrival: You must be in the facility at least 30 minutes prior to your scheduled procedure. Tardiness: Your scheduled time is also the cutoff time. If you do not arrive at least 15 minutes prior to your procedure, you will be rescheduled.  Children: Do not bring any children with you. Make arrangements to keep them home. Dress appropriately: There is always a possibility that your clothing may get soiled. Avoid long dresses. Valuables: Do not bring any jewelry or valuables.  Reasons to call and reschedule or cancel your procedure: (Following these recommendations will minimize the risk of a serious complication.) Surgeries: Avoid having procedures within 2 weeks of any surgery. (Avoid for 2 weeks before or after any surgery). Flu Shots: Avoid having procedures within 2 weeks of a flu shots or . (Avoid for 2 weeks before or after immunizations). Barium: Avoid having a procedure within 7-10 days after having had a radiological study involving the use of radiological contrast. (Myelograms, Barium swallow or enema study). Heart attacks: Avoid any elective procedures or surgeries for the initial 6 months after a "Myocardial Infarction" (Heart Attack). Blood thinners: It is imperative that you stop these medications before procedures. Let us know if you if you take any blood thinner.  Infection: Avoid procedures during or within  two weeks of an infection (including chest colds or gastrointestinal problems). Symptoms associated with infections include: Localized redness, fever, chills, night sweats or profuse sweating, burning sensation  when voiding, cough, congestion, stuffiness, runny nose, sore throat, diarrhea, nausea, vomiting, cold or Flu symptoms, recent or current infections. It is specially important if the infection is over the area that we intend to treat. Heart and lung problems: Symptoms that may suggest an active cardiopulmonary problem include: cough, chest pain, breathing difficulties or shortness of breath, dizziness, ankle swelling, uncontrolled high or unusually low blood pressure, and/or palpitations. If you are experiencing any of these symptoms, cancel your procedure and contact your primary care physician for an evaluation.  Remember:  Regular Business hours are:  Monday to Thursday 8:00 AM to 4:00 PM  Provider's Schedule: Delano Metz, MD:  Procedure days: Tuesday and Thursday 7:30 AM to 4:00 PM  Edward Jolly, MD:  Procedure days: Monday and Wednesday 7:30 AM to 4:00 PM Last  Updated: 05/14/2023 ______________________________________________________________________

## 2023-08-12 ENCOUNTER — Ambulatory Visit: Admission: RE | Admit: 2023-08-12 | Payer: MEDICAID | Source: Ambulatory Visit

## 2023-08-15 ENCOUNTER — Ambulatory Visit
Admission: RE | Admit: 2023-08-15 | Discharge: 2023-08-15 | Disposition: A | Discharge status: Home / Self Care | Payer: MEDICAID | Source: Ambulatory Visit | Attending: Pain Medicine | Admitting: Pain Medicine

## 2023-08-15 DIAGNOSIS — M5412 Radiculopathy, cervical region: Secondary | ICD-10-CM | POA: Insufficient documentation

## 2023-08-15 DIAGNOSIS — M4312 Spondylolisthesis, cervical region: Secondary | ICD-10-CM | POA: Insufficient documentation

## 2023-08-15 DIAGNOSIS — G8929 Other chronic pain: Secondary | ICD-10-CM | POA: Insufficient documentation

## 2023-08-15 DIAGNOSIS — M542 Cervicalgia: Secondary | ICD-10-CM | POA: Insufficient documentation

## 2023-08-15 DIAGNOSIS — M4802 Spinal stenosis, cervical region: Secondary | ICD-10-CM | POA: Diagnosis present

## 2023-08-15 DIAGNOSIS — G4486 Cervicogenic headache: Secondary | ICD-10-CM | POA: Diagnosis present

## 2023-10-10 NOTE — Patient Instructions (Signed)
 ______________________________________________________________________    OTC Supplements:   The following is a list of over-the-counter (OTC) supplements that have been found to have [employer]) studies suggesting that they may be of some benefits when used in moderation in some chronic pain-related conditions.  NOTE:  Always consult with your primary care provider and/or pharmacist before taking any OTC medications to make sure they will not interact with your current medications. Always use manufacturer's recommended dosage.  Supplement Possible benefit May be of benefit in treatment of   Turmeric/curcumin anti-inflammatory Joint and muscle aches and pain.  Glucosamine/chondroitin (triple strength) may slow loss of articular cartilage Joint pain.  Vitamin D-3* may suppress release of chemicals associated with inflammation. Increases tolerance to pain. Joint and muscle aches and pain.   Moringa(+) anti-inflammatory with mild analgesic effects Joint and muscle aches and pain.  Melatonin(+) Helps reset sleep cycle. Insomnia.  Vitamin B-12* may help keep nerves and blood cells healthy as well as maintaining function of nervous system Nerve pain (Burning pain)  Alpha-Lipoic-Acid (ALA)* antioxidant that may help with nerve health, pain, and blocking the activation of some inflammatory chemicals Diabetic neuropathy and metabolic syndrome  superoxide dismutase (SOD)** Currently being reviewed.   Tiger Balm Currently being reviewed.   hydrolyzed collagen peptides* Currently being reviewed.  Collagen supplementation may increases bone strength, density, and mass; may improve joint stiffness/mobility, and functionality; and may reduce joint pain. Possible chondroprotective effects. May help with protection of joint health.   Methylsulfonylmethane (MSM)* Currently being reviewed.   CBD(+) Currently being reviewed.   Delta-8 THC(+) Currently being reviewed.   *  Generally  Recognized As Safe (GRAS) approved substance.-FDA ([employer]) ** "Possibly Safe", but not considered Generally Recognized As Safe (Not GRAS) by the [place] (FDA) as a food additive. (+) Not considered Generally Recognized As Safe (Not GRAS) by the [employer] and Geophysicist/field seismologist (FDA) as a food additive.  ______________________________________________________________________      ______________________________________________________________________    Procedure instructions  Stop blood-thinners  Do not eat or drink fluids (other than water) for 6 hours before your procedure  No water for 2 hours before your procedure  Take your blood pressure medicine with a sip of water  Arrive 30 minutes before your appointment  If sedation is planned, bring suitable driver. Pennie Banter, Benedetto Goad, & public transportation are NOT APPROVED)  Carefully read the "Preparing for your procedure" detailed instructions  If you have questions call us at [phone]  Procedure appointments are for procedures only. NO medication refills or new problem evaluations.   ______________________________________________________________________      ______________________________________________________________________    Preparing for your procedure  Appointments: If you think you may not be able to keep your appointment, call 24-48 hours in advance to cancel. We need time to make it available to others.  Procedure visits are for procedures only. During your procedure appointment there will be: NO Prescription Refills*. NO medication changes or discussions*. NO discussion of disability issues*. NO unrelated pain problem evaluations*. NO evaluations to order other pain procedures*. *These will be addressed at a separate and distinct evaluation encounter on the provider's evaluation schedule and  not during procedure days.  Instructions: Food intake: Avoid eating anything solid for at least 8 hours prior to your procedure. Clear liquid intake: You may take clear liquids such as water up to 2 hours prior to your procedure. (No carbonated drinks. No soda.) Transportation: Unless otherwise stated by your physician, bring a  driver. (Driver cannot be a Market researcher, Pharmacist, community, or any other form of public transportation.) Morning Medicines: Except for blood thinners, take all of your other morning medications with a sip of water. Make sure to take your heart and blood pressure medicines. If your blood pressure's lower number is above 100, the case will be rescheduled. Blood thinners: Make sure to stop your blood thinners as instructed.  If you take a blood thinner, but were not instructed to stop it, call our office [phone] and ask to talk to a nurse. Not stopping a blood thinner prior to certain procedures could lead to serious complications. Diabetics on insulin: Notify the staff so that you can be scheduled 1st case in the morning. If your diabetes requires high dose insulin, take only  of your normal insulin dose the morning of the procedure and notify the staff that you have done so. Preventing infections: Shower with an antibacterial soap the morning of your procedure.  Build-up your immune system: Take 1000 mg of Vitamin C with every meal (3 times a day) the day prior to your procedure. Antibiotics: Inform the nursing staff if you are taking any antibiotics or if you have any conditions that may require antibiotics prior to procedures. (Example: recent joint implants)   Pregnancy: If you are pregnant make sure to notify the nursing staff. Not doing so may result in injury to the fetus, including death.  Sickness: If you have a cold, fever, or any active infections, call and cancel or reschedule your procedure. Receiving steroids while having an infection may result in complications. Arrival: You  must be in the facility at least 30 minutes prior to your scheduled procedure. Tardiness: Your scheduled time is also the cutoff time. If you do not arrive at least 15 minutes prior to your procedure, you will be rescheduled.  Children: Do not bring any children with you. Make arrangements to keep them home. Dress appropriately: There is always a possibility that your clothing may get soiled. Avoid long dresses. Valuables: Do not bring any jewelry or valuables.  Reasons to call and reschedule or cancel your procedure: (Following these recommendations will minimize the risk of a serious complication.) Surgeries: Avoid having procedures within 2 weeks of any surgery. (Avoid for 2 weeks before or after any surgery). Flu Shots: Avoid having procedures within 2 weeks of a flu shots or . (Avoid for 2 weeks before or after immunizations). Barium: Avoid having a procedure within 7-10 days after having had a radiological study involving the use of radiological contrast. (Myelograms, Barium swallow or enema study). Heart attacks: Avoid any elective procedures or surgeries for the initial 6 months after a "Myocardial Infarction" (Heart Attack). Blood thinners: It is imperative that you stop these medications before procedures. Let us know if you if you take any blood thinner.  Infection: Avoid procedures during or within two weeks of an infection (including chest colds or gastrointestinal problems). Symptoms associated with infections include: Localized redness, fever, chills, night sweats or profuse sweating, burning sensation when voiding, cough, congestion, stuffiness, runny nose, sore throat, diarrhea, nausea, vomiting, cold or Flu symptoms, recent or current infections. It is specially important if the infection is over the area that we intend to treat. Heart and lung problems: Symptoms that may suggest an active cardiopulmonary problem include: cough, chest pain, breathing difficulties or shortness of  breath, dizziness, ankle swelling, uncontrolled high or unusually low blood pressure, and/or palpitations. If you are experiencing any of these symptoms, cancel your procedure  and contact your primary care physician for an evaluation.  Remember:  Regular Business hours are:  Monday to Thursday 8:00 AM to 4:00 PM  Provider's Schedule: Delano Metz, MD:  Procedure days: Tuesday and Thursday 7:30 AM to 4:00 PM  Edward Jolly, MD:  Procedure days: Monday and Wednesday 7:30 AM to 4:00 PM Last  Updated: 09/03/2023 ______________________________________________________________________      ______________________________________________________________________    General Risks and Possible Complications  Patient Responsibilities: It is important that you read this as it is part of your informed consent. It is our duty to inform you of the risks and possible complications associated with treatments offered to you. It is your responsibility as a patient to read this and to ask questions about anything that is not clear or that you believe was not covered in this document.  Patient's Rights: You have the right to refuse treatment. You also have the right to change your mind, even after initially having agreed to have the treatment done. However, under this last option, if you wait until the last second to change your mind, you may be charged for the materials used up to that point.  Introduction: Medicine is not an Visual merchandiser. Everything in Medicine, including the lack of treatment(s), carries the potential for danger, harm, or loss (which is by definition: Risk). In Medicine, a complication is a secondary problem, condition, or disease that can aggravate an already existing one. All treatments carry the risk of possible complications. The fact that a side effects or complications occurs, does not imply that the treatment was conducted incorrectly. It must be clearly understood that these can  happen even when everything is done following the highest safety standards.  No treatment: You can choose not to proceed with the proposed treatment alternative. The "PRO(s)" would include: avoiding the risk of complications associated with the therapy. The "CON(s)" would include: not getting any of the treatment benefits. These benefits fall under one of three categories: diagnostic; therapeutic; and/or palliative. Diagnostic benefits include: getting information which can ultimately lead to improvement of the disease or symptom(s). Therapeutic benefits are those associated with the successful treatment of the disease. Finally, palliative benefits are those related to the decrease of the primary symptoms, without necessarily curing the condition (example: decreasing the pain from a flare-up of a chronic condition, such as incurable terminal cancer).  General Risks and Complications: These are associated to most interventional treatments. They can occur alone, or in combination. They fall under one of the following six (6) categories: no benefit or worsening of symptoms; bleeding; infection; nerve damage; allergic reactions; and/or death. No benefits or worsening of symptoms: In Medicine there are no guarantees, only probabilities. No healthcare provider can ever guarantee that a medical treatment will work, they can only state the probability that it may. Furthermore, there is always the possibility that the condition may worsen, either directly, or indirectly, as a consequence of the treatment. Bleeding: This is more common if the patient is taking a blood thinner, either prescription or over the counter (example: Goody Powders, Fish oil, Aspirin, Garlic, etc.), or if suffering a condition associated with impaired coagulation (example: Hemophilia, cirrhosis of the liver, low platelet counts, etc.). However, even if you do not have one on these, it can still happen. If you have any of these conditions, or  take one of these drugs, make sure to notify your treating physician. Infection: This is more common in patients with a compromised immune system, either due to  disease (example: diabetes, cancer, human immunodeficiency virus [HIV], etc.), or due to medications or treatments (example: therapies used to treat cancer and rheumatological diseases). However, even if you do not have one on these, it can still happen. If you have any of these conditions, or take one of these drugs, make sure to notify your treating physician. Nerve Damage: This is more common when the treatment is an invasive one, but it can also happen with the use of medications, such as those used in the treatment of cancer. The damage can occur to small secondary nerves, or to large primary ones, such as those in the spinal cord and brain. This damage may be temporary or permanent and it may lead to impairments that can range from temporary numbness to permanent paralysis and/or brain death. Allergic Reactions: Any time a substance or material comes in contact with our body, there is the possibility of an allergic reaction. These can range from a mild skin rash (contact dermatitis) to a severe systemic reaction (anaphylactic reaction), which can result in death. Death: In general, any medical intervention can result in death, most of the time due to an unforeseen complication. ______________________________________________________________________

## 2023-10-10 NOTE — Progress Notes (Signed)
(  10/14/2023) NO-SHOW.  Patient called on day of appointment to indicate that he had no transportation.

## 2023-10-11 DIAGNOSIS — E785 Hyperlipidemia, unspecified: Secondary | ICD-10-CM | POA: Insufficient documentation

## 2023-10-14 ENCOUNTER — Ambulatory Visit (HOSPITAL_BASED_OUTPATIENT_CLINIC_OR_DEPARTMENT_OTHER): Payer: MEDICAID | Admitting: Pain Medicine

## 2023-10-14 DIAGNOSIS — R94130 Abnormal response to nerve stimulation, unspecified: Secondary | ICD-10-CM

## 2023-10-14 DIAGNOSIS — G63 Polyneuropathy in diseases classified elsewhere: Secondary | ICD-10-CM

## 2023-10-14 DIAGNOSIS — M79641 Pain in right hand: Secondary | ICD-10-CM

## 2023-10-14 DIAGNOSIS — E1142 Type 2 diabetes mellitus with diabetic polyneuropathy: Secondary | ICD-10-CM

## 2023-10-14 DIAGNOSIS — E1140 Type 2 diabetes mellitus with diabetic neuropathy, unspecified: Secondary | ICD-10-CM

## 2023-10-14 DIAGNOSIS — M545 Low back pain, unspecified: Secondary | ICD-10-CM

## 2023-10-14 DIAGNOSIS — Z91199 Patient's noncompliance with other medical treatment and regimen due to unspecified reason: Secondary | ICD-10-CM

## 2023-10-14 DIAGNOSIS — G8929 Other chronic pain: Secondary | ICD-10-CM

## 2023-10-20 NOTE — Progress Notes (Unsigned)
PROVIDER NOTE: Information contained herein reflects review and annotations entered in association with encounter. Interpretation of such information and data should be left to medically-trained personnel. Information provided to patient can be located elsewhere in the medical record under "Patient Instructions". Document created using STT-dictation technology, any transcriptional errors that may result from process are unintentional.    Patient: Todd Wagner  Service Category: E/M  Provider: Oswaldo Done, MD  DOB: [birth year]  DOS: 10/21/2023  Referring Provider: [facility]  MRN: [id number]  Specialty: Interventional Pain Management  PCP: [facility]  Type: Established Patient  Setting: Ambulatory outpatient    Location: Office  Delivery: Face-to-face     HPI  Mr. Caeleb Batalla, a 63 y.o. year old male, is here today because of his No primary diagnosis found.. Mr. Couey primary complain today is No chief complaint on file.  Pertinent problems: Mr. 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, L4-5, L5-S1) (Severe: L4-5); Lumbar central spinal stenosis w/o claudication; Old tear of medial meniscus of knee (Left); Osteoarthritis of hands (Bilateral); Chronic hand pain (1ry area of Pain) (Bilateral); Osteoarthritis of wrists (Bilateral); Low back pain of over 3 months duration; Chronic knee pain (3ry area of Pain) (Left);  Chronic low back pain (2ry area of Pain) (Midline) w/o sciatica; Lumbar facet joint pain; Chronic feet pain (5th area of Pain) (Bilateral) (R=L); Chronic lower extremity pain (4th area of Pain) (Bilateral) (R=L); Abnormal MRI, lumbar spine (01/26/2023); Carpal tunnel syndrome (Bilateral) (L>R); Ulnar neuropathy of upper extremity (Left); Generalized polyneuropathy; Hypertrophy of ligamentum flavum (L2-3, L3-4, L4-5); Abnormal NCS (UE) (nerve conduction studies) (03/11/2023); Traumatic osteoarthritis of wrist (Left); Arthropathy of wrist (Left); Chronic painful diabetic neuropathy (HCC); Neuropathy, median nerve (Bilateral) (L>R); Cubital tunnel syndrome (Left); Acute pain of right shoulder; Calcific tendinitis of shoulder; Medial epicondylitis; Ring finger, trigger finger (Right); Carpal tunnel syndrome (Right); Polyarthralgia; History of carpal tunnel release (05/31/2023) (Right); S/P ring finger trigger finger release (05/31/2023) (Right); Non-articular hand pain (Right); S/P carpal tunnel release (05/31/2023) (Right); Diabetic sensorimotor polyneuropathy (HCC); Chronic neck pain (Bilateral); Cervicogenic headache (Bilateral); Anterolisthesis of cervical spine; Foraminal stenosis of cervical region; and Cervical radiculopathy, chronic on their pertinent problem list. Pain Assessment: Severity of   is reported as a  /10. Location:    / . Onset:  . Quality:  . Timing:  . Modifying factor(s):  Marland Kitchen Vitals:  vitals were not taken for this visit.  BMI: Estimated body mass index is 29.16 kg/m as calculated from the following:   Height as of 08/06/23: 6' (1.829 m).   Weight as of 08/06/23: 215 lb (97.5 kg). Last encounter: 10/14/2023. Last procedure: 07/23/2023.  Reason for encounter:  *** . ***  Discussed the use of AI scribe software for clinical note transcription with the patient, who gave verbal consent to proceed.  History of Present Illness           Pharmacotherapy Assessment  Analgesic:  No chronic  opioid analgesics therapy prescribed by our practice. None MME/day: 0 mg/day   Monitoring: [place] PMP: PDMP reviewed during this encounter.  Pharmacotherapy: No side-effects or adverse reactions reported. Compliance: No problems identified. Effectiveness: Clinically acceptable.  No notes on file  No results found for: "CBDTHCR" No results found for: "D8THCCBX" No results found for: "D9THCCBX"  UDS:  Summary  Date Value Ref Range Status  03/11/2023 Note  Final    Comment:    ==================================================================== Compliance Drug Analysis, Ur ==================================================================== Test                             Result       Flag       Units  Drug Present and Declared for Prescription Verification   Gabapentin                     PRESENT      EXPECTED   Amitriptyline                  PRESENT      EXPECTED   Mirtazapine                    PRESENT      EXPECTED   Trazodone                      PRESENT      EXPECTED   1,3 chlorophenyl piperazine    PRESENT      EXPECTED    1,3-chlorophenyl piperazine is an expected metabolite of trazodone.    Metoprolol                     PRESENT      EXPECTED  Drug Absent but Declared for Prescription Verification   Hydrocodone                    Not Detected UNEXPECTED ng/mg creat   Acetaminophen                  Not Detected UNEXPECTED    Acetaminophen, as indicated in the declared medication list, is not    always detected even when used as directed.    Salicylate                     Not Detected UNEXPECTED    Aspirin, as indicated in the declared medication list, is not always    detected even when used as directed.    Diphenhydramine                Not Detected UNEXPECTED ==================================================================== Test                      Result    Flag   Units      Ref Range   Creatinine              67               mg/dL       >=16 ==================================================================== Declared Medications:  The flagging and interpretation on this report are based on the  following declared medications.  Unexpected results may arise from  inaccuracies in the declared medications.   **Note: The testing scope of this panel includes these medications:   Amitriptyline (Elavil)  Diphenhydramine (Sominex)  Gabapentin (Neurontin)  Hydrocodone (Norco)  Metoprolol (Lopressor)  Mirtazapine (Remeron)  Trazodone (Desyrel)   **Note: The testing scope of this panel does not include small to  moderate  amounts of these reported medications:   Acetaminophen (Tylenol)  Acetaminophen (Norco)  Aspirin   **Note: The testing scope of this panel does not include the  following reported medications:   Albuterol (Ventolin HFA)  Atorvastatin (Lipitor)  Colchicine  Glipizide (Glucotrol)  Insulin (NovoLog)  Lisinopril (Zestril)  Melatonin  Meloxicam (Mobic)  Metformin (Glucophage)  Nitroglycerin (Nitrostat)  Omeprazole (Prilosec)  Ondansetron (Zofran) ==================================================================== For clinical consultation, please call [phone]. ====================================================================       ROS  Constitutional: Denies any fever or chills Gastrointestinal: No reported hemesis, hematochezia, vomiting, or acute GI distress Musculoskeletal: Denies any acute onset joint swelling, redness, loss of ROM, or weakness Neurological: No reported episodes of acute onset apraxia, aphasia, dysarthria, agnosia, amnesia, paralysis, loss of coordination, or loss of consciousness  Medication Review  Accu-Chek Guide Me, Dexcom G7 Sensor, HYDROcodone-acetaminophen, Melatonin, Semaglutide(0.25 or 0.5MG /DOS), acetaminophen, albuterol, amitriptyline, aspirin, atorvastatin, colchicine, diclofenac Sodium, diphenhydrAMINE, gabapentin, glipiZIDE, insulin aspart protamine  - aspart, insulin glargine, insulin lispro, lisinopril, meloxicam, metFORMIN, metoprolol tartrate, mirtazapine, nitroGLYCERIN, omeprazole, ondansetron, and traZODone  History Review  Allergy: Mr. Creasy has no known allergies. Drug: Mr. Standage  reports current drug use. Drug: Marijuana. Alcohol:  reports current alcohol use. Tobacco:  reports that he has never smoked. He has never used smokeless tobacco. Social: Mr. Pieczynski  reports that he has never smoked. He has never used smokeless tobacco. He reports current alcohol use. He reports current drug use. Drug: Marijuana. Medical:  has a past medical history of Diabetes mellitus without complication (HCC) and Hypertension. Surgical: Mr. Zobrist  has a past surgical history that includes Appendectomy; Coronary angioplasty with stent; and LEFT HEART CATH AND CORONARY ANGIOGRAPHY (N/A, 05/23/2017). Family: family history is not on file.  Laboratory Chemistry Profile   Renal Lab Results  Component Value Date   BUN 22 03/11/2023   CREATININE 1.58 (H) 03/11/2023   BCR 14 03/11/2023   GFRAA >60 06/16/2017   GFRNONAA >60 06/16/2017    Hepatic Lab Results  Component Value Date   AST 14 03/11/2023   ALT 29 06/16/2017   ALBUMIN 4.5 03/11/2023   ALKPHOS 144 (H) 03/11/2023   LIPASE 18 06/16/2017    Electrolytes Lab Results  Component Value Date   NA 133 (L) 03/11/2023   K CANCELED 03/11/2023   CL 101 03/11/2023   CALCIUM 9.3 03/11/2023   MG 1.7 03/11/2023    Bone Lab Results  Component Value Date   25OHVITD1 27 (L) 03/11/2023   25OHVITD2 7.5 03/11/2023   25OHVITD3 19 03/11/2023    Inflammation (CRP: Acute Phase) (ESR: Chronic Phase) Lab Results  Component Value Date   CRP 2 03/11/2023   ESRSEDRATE 16 03/11/2023         Note: Above Lab results reviewed.  Recent Imaging Review  MR CERVICAL SPINE WO CONTRAST CLINICAL DATA:  Initial evaluation for chronic neck pain.  EXAM: MRI CERVICAL SPINE WITHOUT  CONTRAST  TECHNIQUE: Multiplanar, multisequence MR imaging of the cervical spine was performed. No intravenous contrast was administered.  COMPARISON:  Prior radiograph from 07/23/2023  FINDINGS: Alignment: Straightening with mild reversal of the normal cervical lordosis. Trace facet mediated anterolisthesis of C4 on C5 and C7 on T1.  Vertebrae: Vertebral body height maintained without acute or chronic fracture. Bone marrow signal intensity within normal limits. No worrisome osseous lesions. Mild reactive marrow edema present about the right C5-6 and left C7-T1 facets due to facet arthritis.  Cord: Normal signal and morphology.  Posterior Fossa, vertebral arteries, paraspinal tissues: Unremarkable.  Disc levels:  C2-C3: Minimal uncovertebral spurring without significant disc bulge. Moderate left-sided facet arthrosis. No spinal stenosis. Foramina remain patent.  C3-C4: Central disc protrusion indents the ventral thecal sac, contacting and mildly flattening the ventral cord (series 9, image 11). Superimposed moderate left with mild right facet hypertrophy. Resultant mild-to-moderate spinal stenosis. Superimposed uncovertebral spurring with severe bilateral C4 foraminal narrowing.  C4-C5: Mild disc bulge with bilateral uncovertebral spurring. Moderate right facet arthrosis. No significant spinal stenosis. Mild left with moderate right C5 foraminal narrowing.  C5-C6: Degenerative disc space narrowing with diffuse disc osteophyte complex, asymmetric to the right. Flattening of the ventral thecal sac without significant spinal stenosis. Severe right with mild left C6 foraminal narrowing.  C6-C7: Moderate degenerative intervertebral disc space narrowing with circumferential disc osteophyte complex. Broad posterior component flattens the ventral thecal sac, asymmetric to the right. Minimal cord flattening without cord signal changes. No significant spinal stenosis. Severe  right worse than left C7 foraminal narrowing.  C7-T1: Mild disc bulge. Moderate left-sided facet arthrosis. No spinal stenosis. Mild left C8 foraminal narrowing. Right neural foramen remains patent.  IMPRESSION: 1. Multilevel cervical spondylosis with resultant mild to moderate spinal stenosis at C3-4. 2. Multifactorial degenerative changes with resultant multilevel foraminal narrowing as above. Notable findings include severe bilateral C4 foraminal stenosis, moderate right C5 foraminal narrowing, severe right C6 foraminal stenosis, with severe right worse than left C7 foraminal narrowing. 3. Moderate multilevel facet arthrosis as above, most pronounced at C2-3 and C3-4 on the left, C4-5 on the right, and C7-T1 on the left. Findings could contribute to neck pain.  Electronically Signed   By: Rise Mu M.D.   On: 08/26/2023 04:35 Note: Reviewed        Physical Exam  General appearance: Well nourished, well developed, and well hydrated. In no apparent acute distress Mental status: Alert, oriented x 3 (person, place, & time)       Respiratory: No evidence of acute respiratory distress Eyes: PERLA Vitals: There were no vitals taken for this visit. BMI: Estimated body mass index is 29.16 kg/m as calculated from the following:   Height as of 08/06/23: 6' (1.829 m).   Weight as of 08/06/23: 215 lb (97.5 kg). Ideal: Patient weight not recorded  Assessment   Diagnosis Status  No diagnosis found. Controlled Controlled Controlled   Updated Problems: No problems updated.  Plan of Care  Problem-specific:  Assessment and Plan            Mr. Chanson Teems has a current medication list which includes the following long-term medication(s): amitriptyline, atorvastatin, colchicine, diphenhydramine, gabapentin, glipizide, insulin aspart protamine - aspart, insulin lispro, lisinopril, metoprolol tartrate, mirtazapine, nitroglycerin, omeprazole, trazodone, and ventolin  hfa.  Pharmacotherapy (Medications Ordered): No orders of the defined types were placed in this encounter.  Orders:  No orders of the defined types were placed in this encounter.  Follow-up plan:   No follow-ups on file.      Interventional Therapies  Risk Factors  Considerations:  Polysubstance abuse  homicidal ideations  Hx. ETOH abuse  Hx.  Marijuana abuse  T2IDDM  CAD  HTN  GERD  Gastroparesis  Angina Pectoris    Planned  Pending:      Under consideration:   Therapeutic bilateral lower extremity Qutenza treatment #2  Diagnostic/therapeutic bilateral scaphoid-radial joint (small joint) injection #1  Diagnostic/therapeutic bilateral L3-4 (L3NR) TFESI #1  Diagnostic/therapeutic bilateral L4-5 (L4NR) TFESI #1  Diagnostic/therapeutic bilateral L5-S1 (L5NR) TFESI #1  Therapeutic bilateral L2-3, L3-4, and L4-5  MILD procedure #1  Diagnostic/therapeutic bilateral L3-4, L4-5 lumbar facet MBB #1  Diagnostic/therapeutic left knee genicular nerve block #1    Completed:   Therapeutic bilateral lower extremity Qutenza treatment x1 (07/23/2023) Referral to hand surgeon for bilateral carpal tunnel release (04/17/2023)    Therapeutic  Palliative (PRN) options:   None established   Completed by other providers:   (05/31/2023) right carpal tunnel and right ring trigger finger release by Evalee Jefferson, MD ([facility]) (03/11/2023) UE EMG/PNCV: Conclusions: This is an abnormal study. These electrodiagnostic findings show evidence of bilateral,  (L>R), severe median neuropathies at the wrists. There is also evidence of a left ulnar neuropathy at the elbow. Electrodiagnostic findings in the arms are suggestive of a generalized polyneuropathy, as can be seen with diabetes. By Elisabeth Most, MD [facility])        Recent Visits Date Type Provider Dept  08/06/23 Office Visit Delano Metz, MD [facility]  07/23/23 Procedure visit Delano Metz,  MD [facility]  Showing recent visits within past 90 days and meeting all other requirements Future Appointments Date Type Provider Dept  10/21/23 Appointment Delano Metz, MD [facility]  Showing future appointments within next 90 days and meeting all other requirements  I discussed the assessment and treatment plan with the patient. The patient was provided an opportunity to ask questions and all were answered. The patient agreed with the plan and demonstrated an understanding of the instructions.  Patient advised to call back or seek an in-person evaluation if the symptoms or condition worsens.  Duration of encounter: *** minutes.  Total time on encounter, as per AMA guidelines included both the face-to-face and non-face-to-face time personally spent by the physician and/or other qualified health care professional(s) on the day of the encounter (includes time in activities that require the physician or other qualified health care professional and does not include time in activities normally performed by clinical staff). Physician's time may include the following activities when performed: Preparing to see the patient (e.g., pre-charting review of records, searching for previously ordered imaging, lab work, and nerve conduction tests) Review of prior analgesic pharmacotherapies. Reviewing PMP Interpreting ordered tests (e.g., lab work, imaging, nerve conduction tests) Performing post-procedure evaluations, including interpretation of diagnostic procedures Obtaining and/or reviewing separately obtained history Performing a medically appropriate examination and/or evaluation Counseling and educating the patient/family/caregiver Ordering medications, tests, or procedures Referring and communicating with other health care professionals (when not separately reported) Documenting clinical information in the electronic or other health record Independently interpreting  results (not separately reported) and communicating results to the patient/ family/caregiver Care coordination (not separately reported)  Note by: Oswaldo Done, MD Date: 10/21/2023; Time: 6:01 PM

## 2023-10-21 ENCOUNTER — Ambulatory Visit (HOSPITAL_BASED_OUTPATIENT_CLINIC_OR_DEPARTMENT_OTHER): Payer: MEDICAID | Admitting: Pain Medicine

## 2023-10-21 DIAGNOSIS — Z91199 Patient's noncompliance with other medical treatment and regimen due to unspecified reason: Secondary | ICD-10-CM

## 2023-10-21 DIAGNOSIS — G8929 Other chronic pain: Secondary | ICD-10-CM

## 2023-11-21 NOTE — Progress Notes (Deleted)
 PROVIDER NOTE: Information contained herein reflects review and annotations entered in association with encounter. Interpretation of such information and data should be left to medically-trained personnel. Information provided to patient can be located elsewhere in the medical record under "Patient Instructions". Document created using STT-dictation technology, any transcriptional errors that may result from process are unintentional.    Patient: Todd Wagner  Service Category: E/M  Provider: Oswaldo Done, MD  DOB: 1961/02/10  DOS: 11/25/2023  Referring Provider: [facility]  MRN: [id number]  Specialty: Interventional Pain Management  PCP: [facility]  Type: Established Patient  Setting: Ambulatory outpatient    Location: Office  Delivery: Face-to-face     HPI  [name], a 63 y.o. year old male, is here today because of his No primary diagnosis found.. [name] primary complain today is No chief complaint on file.  Pertinent problems: 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, L4-5, L5-S1) (Severe: L4-5); Lumbar central spinal stenosis w/o claudication; Old tear of medial meniscus of knee (Left); Osteoarthritis of hands (Bilateral); Chronic hand pain (1ry area of Pain) (Bilateral); Osteoarthritis of wrists (Bilateral); Low back pain of over 3 months duration; Chronic knee pain (3ry area of Pain) (Left);  Chronic low back pain (2ry area of Pain) (Midline) w/o sciatica; Lumbar facet joint pain; Chronic feet pain (5th area of Pain) (Bilateral) (R=L); Chronic lower extremity pain (4th area of Pain) (Bilateral) (R=L); Abnormal MRI, lumbar spine (01/26/2023); Carpal tunnel syndrome (Bilateral) (L>R); Ulnar neuropathy of upper extremity (Left); Generalized polyneuropathy; Hypertrophy of ligamentum flavum (L2-3, L3-4, L4-5); Abnormal NCS (UE) (nerve conduction studies) (03/11/2023); Traumatic osteoarthritis of wrist (Left); Arthropathy of wrist (Left); Chronic painful diabetic neuropathy (HCC); Neuropathy, median nerve (Bilateral) (L>R); Cubital tunnel syndrome (Left); Acute pain of right shoulder; Calcific tendinitis of shoulder; Medial epicondylitis; Ring finger, trigger finger (Right); Carpal tunnel syndrome (Right); Polyarthralgia; History of carpal tunnel release (05/31/2023) (Right); S/P ring finger trigger finger release (05/31/2023) (Right); Non-articular hand pain (Right); S/P carpal tunnel release (05/31/2023) (Right); Diabetic sensorimotor polyneuropathy (HCC); Chronic neck pain (Bilateral); Cervicogenic headache (Bilateral); Anterolisthesis of cervical spine; Foraminal stenosis of cervical region; and Cervical radiculopathy, chronic on their pertinent problem list. Pain Assessment: Severity of   is reported as a  /10. Location:    / . Onset:  . Quality:  . Timing:  . Modifying factor(s):  Marland Kitchen Vitals:  vitals were not taken for this visit.  BMI: Estimated body mass index is 29.16 kg/m as calculated from the following:   Height as of 08/06/23: 6' (1.829 m).   Weight as of 08/06/23: 215 lb (97.5 kg). Last encounter: 10/14/2023. Last procedure: 07/23/2023.  Reason for encounter: evaluation of worsening, or previously known (established) problem. ***  Discussed the use of AI scribe software for clinical note transcription with the patient, who gave verbal consent to proceed.  History of Present Illness            Pharmacotherapy Assessment  Analgesic: No chronic opioid analgesics therapy prescribed by our practice. None MME/day: 0 mg/day   Monitoring: [place] PMP: PDMP reviewed during this  encounter.       Pharmacotherapy: No side-effects or adverse reactions reported. Compliance: No problems identified. Effectiveness: Clinically acceptable.  No notes on file  No results found for: "CBDTHCR" No results found for: "D8THCCBX" No results found for: "D9THCCBX"  UDS:  Summary  Date Value Ref Range Status  03/11/2023 Note  Final    Comment:    ==================================================================== Compliance Drug Analysis, Ur ==================================================================== Test                             Result       Flag       Units  Drug Present and Declared for Prescription Verification   Gabapentin                     PRESENT      EXPECTED   Amitriptyline                  PRESENT      EXPECTED   Mirtazapine                    PRESENT      EXPECTED   Trazodone                      PRESENT      EXPECTED   1,3 chlorophenyl piperazine    PRESENT      EXPECTED    1,3-chlorophenyl piperazine is an expected metabolite of trazodone.    Metoprolol                     PRESENT      EXPECTED  Drug Absent but Declared for Prescription Verification   Hydrocodone                    Not Detected UNEXPECTED ng/mg creat   Acetaminophen                  Not Detected UNEXPECTED    Acetaminophen, as indicated in the declared medication list, is not    always detected even when used as directed.    Salicylate                     Not Detected UNEXPECTED    Aspirin, as indicated in the declared medication list, is not always    detected even when used as directed.    Diphenhydramine                Not Detected UNEXPECTED ==================================================================== Test                      Result    Flag   Units      Ref Range   Creatinine               67               mg/dL      >=78 ==================================================================== Declared Medications:  The flagging and interpretation on this report are based on the  following declared medications.  Unexpected results may arise from  inaccuracies in the declared medications.   **Note: The testing scope of this panel includes these medications:   Amitriptyline (Elavil)  Diphenhydramine (Sominex)  Gabapentin (Neurontin)  Hydrocodone (Norco)  Metoprolol (Lopressor)  Mirtazapine (Remeron)  Trazodone (Desyrel)   **Note: The testing scope of this panel  does not include small to  moderate amounts of these reported medications:   Acetaminophen (Tylenol)  Acetaminophen (Norco)  Aspirin   **Note: The testing scope of this panel does not include the  following reported medications:   Albuterol (Ventolin HFA)  Atorvastatin (Lipitor)  Colchicine  Glipizide (Glucotrol)  Insulin (NovoLog)  Lisinopril (Zestril)  Melatonin  Meloxicam (Mobic)  Metformin (Glucophage)  Nitroglycerin (Nitrostat)  Omeprazole (Prilosec)  Ondansetron (Zofran) ==================================================================== For clinical consultation, please call [phone]. ====================================================================       ROS  Constitutional: Denies any fever or chills Gastrointestinal: No reported hemesis, hematochezia, vomiting, or acute GI distress Musculoskeletal: Denies any acute onset joint swelling, redness, loss of ROM, or weakness Neurological: No reported episodes of acute onset apraxia, aphasia, dysarthria, agnosia, amnesia, paralysis, loss of coordination, or loss of consciousness  Medication Review  Accu-Chek Guide Me, Dexcom G7 Sensor, HYDROcodone-acetaminophen, Melatonin, Semaglutide(0.25 or 0.5MG /DOS), acetaminophen, albuterol, amitriptyline, aspirin, atorvastatin, colchicine, diclofenac Sodium, diphenhydrAMINE, gabapentin,  glipiZIDE, insulin aspart protamine - aspart, insulin glargine, insulin lispro, lisinopril, meloxicam, metFORMIN, metoprolol tartrate, mirtazapine, nitroGLYCERIN, omeprazole, ondansetron, and traZODone  History Review  Allergy: Mr. Bannan has no known allergies. Drug: Mr. Pierpoint  reports current drug use. Drug: Marijuana. Alcohol:  reports current alcohol use. Tobacco:  reports that he has never smoked. He has never used smokeless tobacco. Social: Mr. Standre  reports that he has never smoked. He has never used smokeless tobacco. He reports current alcohol use. He reports current drug use. Drug: Marijuana. Medical:  has a past medical history of Diabetes mellitus without complication (HCC) and Hypertension. Surgical: Mr. Wrightsman  has a past surgical history that includes Appendectomy; Coronary angioplasty with stent; and LEFT HEART CATH AND CORONARY ANGIOGRAPHY (N/A, 05/23/2017). Family: family history is not on file.  Laboratory Chemistry Profile   Renal Lab Results  Component Value Date   BUN 22 03/11/2023   CREATININE 1.58 (H) 03/11/2023   BCR 14 03/11/2023   GFRAA >60 06/16/2017   GFRNONAA >60 06/16/2017    Hepatic Lab Results  Component Value Date   AST 14 03/11/2023   ALT 29 06/16/2017   ALBUMIN 4.5 03/11/2023   ALKPHOS 144 (H) 03/11/2023   LIPASE 18 06/16/2017    Electrolytes Lab Results  Component Value Date   NA 133 (L) 03/11/2023   K CANCELED 03/11/2023   CL 101 03/11/2023   CALCIUM 9.3 03/11/2023   MG 1.7 03/11/2023    Bone Lab Results  Component Value Date   25OHVITD1 27 (L) 03/11/2023   25OHVITD2 7.5 03/11/2023   25OHVITD3 19 03/11/2023    Inflammation (CRP: Acute Phase) (ESR: Chronic Phase) Lab Results  Component Value Date   CRP 2 03/11/2023   ESRSEDRATE 16 03/11/2023         Note: Above Lab results reviewed.  Recent Imaging Review  MR CERVICAL SPINE WO CONTRAST CLINICAL DATA:  Initial evaluation for chronic neck pain.  EXAM: MRI CERVICAL SPINE  WITHOUT CONTRAST  TECHNIQUE: Multiplanar, multisequence MR imaging of the cervical spine was performed. No intravenous contrast was administered.  COMPARISON:  Prior radiograph from 07/23/2023  FINDINGS: Alignment: Straightening with mild reversal of the normal cervical lordosis. Trace facet mediated anterolisthesis of C4 on C5 and C7 on T1.  Vertebrae: Vertebral body height maintained without acute or chronic fracture. Bone marrow signal intensity within normal limits. No worrisome osseous lesions. Mild reactive marrow edema present about the right C5-6 and left C7-T1 facets due to facet arthritis.  Cord: Normal signal and morphology.  Posterior Fossa, vertebral arteries, paraspinal tissues: Unremarkable.  Disc levels:  C2-C3: Minimal uncovertebral spurring without significant disc bulge. Moderate left-sided facet arthrosis. No spinal stenosis. Foramina remain patent.  C3-C4: Central disc protrusion indents the ventral thecal sac, contacting and mildly flattening the ventral cord (series 9, image 11). Superimposed moderate left with mild right facet hypertrophy. Resultant mild-to-moderate spinal stenosis. Superimposed uncovertebral spurring with severe bilateral C4 foraminal narrowing.  C4-C5: Mild disc bulge with bilateral uncovertebral spurring. Moderate right facet arthrosis. No significant spinal stenosis. Mild left with moderate right C5 foraminal narrowing.  C5-C6: Degenerative disc space narrowing with diffuse disc osteophyte complex, asymmetric to the right. Flattening of the ventral thecal sac without significant spinal stenosis. Severe right with mild left C6 foraminal narrowing.  C6-C7: Moderate degenerative intervertebral disc space narrowing with circumferential disc osteophyte complex. Broad posterior component flattens the ventral thecal sac, asymmetric to the right. Minimal cord flattening without cord signal changes. No significant spinal stenosis.  Severe right worse than left C7 foraminal narrowing.  C7-T1: Mild disc bulge. Moderate left-sided facet arthrosis. No spinal stenosis. Mild left C8 foraminal narrowing. Right neural foramen remains patent.  IMPRESSION: 1. Multilevel cervical spondylosis with resultant mild to moderate spinal stenosis at C3-4. 2. Multifactorial degenerative changes with resultant multilevel foraminal narrowing as above. Notable findings include severe bilateral C4 foraminal stenosis, moderate right C5 foraminal narrowing, severe right C6 foraminal stenosis, with severe right worse than left C7 foraminal narrowing. 3. Moderate multilevel facet arthrosis as above, most pronounced at C2-3 and C3-4 on the left, C4-5 on the right, and C7-T1 on the left. Findings could contribute to neck pain.  Electronically Signed   By: Rise Mu M.D.   On: 08/26/2023 04:35 Note: Reviewed        Physical Exam  General appearance: Well nourished, well developed, and well hydrated. In no apparent acute distress Mental status: Alert, oriented x 3 (person, place, & time)       Respiratory: No evidence of acute respiratory distress Eyes: PERLA Vitals: There were no vitals taken for this visit. BMI: Estimated body mass index is 29.16 kg/m as calculated from the following:   Height as of 08/06/23: 6' (1.829 m).   Weight as of 08/06/23: 215 lb (97.5 kg). Ideal: Patient weight not recorded  Assessment   Diagnosis Status  No diagnosis found. Controlled Controlled Controlled   Updated Problems: No problems updated.  Plan of Care  Problem-specific:  Assessment and Plan            Mr. Alexei Doswell has a current medication list which includes the following long-term medication(s): amitriptyline, atorvastatin, colchicine, diphenhydramine, gabapentin, glipizide, insulin aspart protamine - aspart, insulin lispro, lisinopril, metoprolol tartrate, mirtazapine, nitroglycerin, omeprazole, trazodone, and  ventolin hfa.  Pharmacotherapy (Medications Ordered): No orders of the defined types were placed in this encounter.  Orders:  No orders of the defined types were placed in this encounter.  Follow-up plan:   No follow-ups on file.      Interventional Therapies  Risk Factors  Considerations:  Polysubstance abuse  homicidal ideations  Hx. ETOH abuse  Hx.  Marijuana abuse  T2IDDM  CAD  HTN  GERD  Gastroparesis  Angina Pectoris    Planned  Pending:      Under consideration:   Therapeutic bilateral lower extremity Qutenza treatment #2  Diagnostic/therapeutic bilateral scaphoid-radial joint (small joint) injection #1  Diagnostic/therapeutic bilateral L3-4 (L3NR) TFESI #1  Diagnostic/therapeutic bilateral L4-5 (L4NR) TFESI #1  Diagnostic/therapeutic bilateral L5-S1 (L5NR) TFESI #  1  Therapeutic bilateral L2-3, L3-4, and L4-5 MILD procedure #1  Diagnostic/therapeutic bilateral L3-4, L4-5 lumbar facet MBB #1  Diagnostic/therapeutic left knee genicular nerve block #1    Completed:   Therapeutic bilateral lower extremity Qutenza treatment x1 (07/23/2023) Referral to hand surgeon for bilateral carpal tunnel release (04/17/2023)    Therapeutic  Palliative (PRN) options:   None established   Completed by other providers:   (05/31/2023) right carpal tunnel and right ring trigger finger release by Evalee Jefferson, MD ([facility]) (03/11/2023) UE EMG/PNCV: Conclusions: This is an abnormal study. These electrodiagnostic findings show evidence of bilateral,  (L>R), severe median neuropathies at the wrists. There is also evidence of a left ulnar neuropathy at the elbow. Electrodiagnostic findings in the arms are suggestive of a generalized polyneuropathy, as can be seen with diabetes. By Elisabeth Most, MD [facility])        Recent Visits No visits were found meeting these conditions. Showing recent visits within past 90 days and meeting all other  requirements Future Appointments Date Type Provider Dept  11/25/23 Appointment Delano Metz, MD [facility]  Showing future appointments within next 90 days and meeting all other requirements  I discussed the assessment and treatment plan with the patient. The patient was provided an opportunity to ask questions and all were answered. The patient agreed with the plan and demonstrated an understanding of the instructions.  Patient advised to call back or seek an in-person evaluation if the symptoms or condition worsens.  Duration of encounter: *** minutes.  Total time on encounter, as per AMA guidelines included both the face-to-face and non-face-to-face time personally spent by the physician and/or other qualified health care professional(s) on the day of the encounter (includes time in activities that require the physician or other qualified health care professional and does not include time in activities normally performed by clinical staff). Physician's time may include the following activities when performed: Preparing to see the patient (e.g., pre-charting review of records, searching for previously ordered imaging, lab work, and nerve conduction tests) Review of prior analgesic pharmacotherapies. Reviewing PMP Interpreting ordered tests (e.g., lab work, imaging, nerve conduction tests) Performing post-procedure evaluations, including interpretation of diagnostic procedures Obtaining and/or reviewing separately obtained history Performing a medically appropriate examination and/or evaluation Counseling and educating the patient/family/caregiver Ordering medications, tests, or procedures Referring and communicating with other health care professionals (when not separately reported) Documenting clinical information in the electronic or other health record Independently interpreting results (not separately reported) and communicating results to the patient/  family/caregiver Care coordination (not separately reported)  Note by: Oswaldo Done, MD Date: 11/25/2023; Time: 11:10 AM

## 2023-11-25 ENCOUNTER — Ambulatory Visit: Payer: MEDICAID | Admitting: Pain Medicine

## 2023-12-02 ENCOUNTER — Ambulatory Visit: Payer: MEDICAID | Admitting: Pain Medicine

## 2023-12-23 NOTE — Progress Notes (Unsigned)
 PROVIDER NOTE: Information contained herein reflects review and annotations entered in association with encounter. Interpretation of such information and data should be left to medically-trained personnel. Information provided to patient can be located elsewhere in the medical record under "Patient Instructions". Document created using STT-dictation technology, any transcriptional errors that may result from process are unintentional.    Patient: Todd Wagner  Service Category: E/M  Provider: Oswaldo Done, MD  DOB: [birth year]  DOS: 12/25/2023  Referring Provider: [facility]  MRN: [id number]  Specialty: Interventional Pain Management  PCP: [facility]  Type: Established Patient  Setting: Ambulatory outpatient    Location: Office  Delivery: Face-to-face     HPI  [name], a 63 y.o. year old male, is here today because of his No primary diagnosis found.. [name] primary complain today is No chief complaint on file.  Pertinent problems: 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, L4-5, L5-S1) (Severe: L4-5); Lumbar central spinal stenosis w/o claudication; Old tear of medial meniscus of knee (Left); Osteoarthritis of hands (Bilateral); Chronic hand pain (1ry area of Pain) (Bilateral); Osteoarthritis of wrists (Bilateral); Low back pain of over 3 months duration; Chronic knee pain (3ry area of Pain) (Left);  Chronic low back pain (2ry area of Pain) (Midline) w/o sciatica; Lumbar facet joint pain; Chronic feet pain (5th area of Pain) (Bilateral) (R=L); Chronic lower extremity pain (4th area of Pain) (Bilateral) (R=L); Abnormal MRI, lumbar spine (01/26/2023); Carpal tunnel syndrome (Bilateral) (L>R); Ulnar neuropathy of upper extremity (Left); Generalized polyneuropathy; Hypertrophy of ligamentum flavum (L2-3, L3-4, L4-5); Abnormal NCS (UE) (nerve conduction studies) (03/11/2023); Traumatic osteoarthritis of wrist (Left); Arthropathy of wrist (Left); Chronic painful diabetic neuropathy (HCC); Neuropathy, median nerve (Bilateral) (L>R); Cubital tunnel syndrome (Left); Acute pain of right shoulder; Calcific tendinitis of shoulder; Medial epicondylitis; Ring finger, trigger finger (Right); Carpal tunnel syndrome (Right); Polyarthralgia; History of carpal tunnel release (05/31/2023) (Right); S/P ring finger trigger finger release (05/31/2023) (Right); Non-articular hand pain (Right); S/P carpal tunnel release (05/31/2023) (Right); Diabetic sensorimotor polyneuropathy (HCC); Chronic neck pain (Bilateral); Cervicogenic headache (Bilateral); Anterolisthesis of cervical spine; Foraminal stenosis of cervical region; and Cervical radiculopathy, chronic on their pertinent problem list. Pain Assessment: Severity of   is reported as a  /10. Location:    / . Onset:  . Quality:  . Timing:  . Modifying factor(s):  Marland Kitchen Vitals:  vitals were not taken for this visit.  BMI: Estimated body mass index is 29.16 kg/m as calculated from the following:   Height as of 08/06/23: 6' (1.829 m).   Weight as of 08/06/23: 215 lb (97.5 kg). Last encounter: 10/14/2023. Last procedure: 07/23/2023.  Reason for encounter: evaluation of worsening, or previously known (established) problem. ***  Discussed the use of AI scribe software for clinical note transcription with the patient, who gave verbal consent to proceed.  History of Present Illness            Pharmacotherapy Assessment  Analgesic: No chronic opioid analgesics therapy prescribed by our practice. None MME/day: 0 mg/day   Monitoring: [place] PMP: PDMP reviewed during this  encounter.       Pharmacotherapy: No side-effects or adverse reactions reported. Compliance: No problems identified. Effectiveness: Clinically acceptable.  No notes on file  No results found for: "CBDTHCR" No results found for: "D8THCCBX" No results found for: "D9THCCBX"  UDS:  Summary  Date Value Ref Range Status  03/11/2023 Note  Final    Comment:    ==================================================================== Compliance Drug Analysis, Ur ==================================================================== Test                             Result       Flag       Units  Drug Present and Declared for Prescription Verification   Gabapentin                     PRESENT      EXPECTED   Amitriptyline                  PRESENT      EXPECTED   Mirtazapine                    PRESENT      EXPECTED   Trazodone                      PRESENT      EXPECTED   1,3 chlorophenyl piperazine    PRESENT      EXPECTED    1,3-chlorophenyl piperazine is an expected metabolite of trazodone.    Metoprolol                     PRESENT      EXPECTED  Drug Absent but Declared for Prescription Verification   Hydrocodone                    Not Detected UNEXPECTED ng/mg creat   Acetaminophen                  Not Detected UNEXPECTED    Acetaminophen, as indicated in the declared medication list, is not    always detected even when used as directed.    Salicylate                     Not Detected UNEXPECTED    Aspirin, as indicated in the declared medication list, is not always    detected even when used as directed.    Diphenhydramine                Not Detected UNEXPECTED ==================================================================== Test                      Result    Flag   Units      Ref Range   Creatinine               67               mg/dL      >=52 ==================================================================== Declared Medications:  The flagging and interpretation on this report are based on the  following declared medications.  Unexpected results may arise from  inaccuracies in the declared medications.   **Note: The testing scope of this panel includes these medications:   Amitriptyline (Elavil)  Diphenhydramine (Sominex)  Gabapentin (Neurontin)  Hydrocodone (Norco)  Metoprolol (Lopressor)  Mirtazapine (Remeron)  Trazodone (Desyrel)   **Note: The testing scope of this panel  does not include small to  moderate amounts of these reported medications:   Acetaminophen (Tylenol)  Acetaminophen (Norco)  Aspirin   **Note: The testing scope of this panel does not include the  following reported medications:   Albuterol (Ventolin HFA)  Atorvastatin (Lipitor)  Colchicine  Glipizide (Glucotrol)  Insulin (NovoLog)  Lisinopril (Zestril)  Melatonin  Meloxicam (Mobic)  Metformin (Glucophage)  Nitroglycerin (Nitrostat)  Omeprazole (Prilosec)  Ondansetron (Zofran) ==================================================================== For clinical consultation, please call [phone]. ====================================================================       ROS  Constitutional: Denies any fever or chills Gastrointestinal: No reported hemesis, hematochezia, vomiting, or acute GI distress Musculoskeletal: Denies any acute onset joint swelling, redness, loss of ROM, or weakness Neurological: No reported episodes of acute onset apraxia, aphasia, dysarthria, agnosia, amnesia, paralysis, loss of coordination, or loss of consciousness  Medication Review  Accu-Chek Guide Me, Dexcom G7 Sensor, HYDROcodone-acetaminophen, Melatonin, Semaglutide(0.25 or 0.5MG /DOS), acetaminophen, albuterol, amitriptyline, aspirin, atorvastatin, colchicine, diclofenac Sodium, diphenhydrAMINE, gabapentin,  glipiZIDE, insulin aspart protamine - aspart, insulin glargine, insulin lispro, lisinopril, meloxicam, metFORMIN, metoprolol tartrate, mirtazapine, nitroGLYCERIN, omeprazole, ondansetron, and traZODone  History Review  Allergy: [name] has no known allergies. Drug: [name]  reports current drug use. Drug: Marijuana. Alcohol:  reports current alcohol use. Tobacco:  reports that he has never smoked. He has never used smokeless tobacco. Social: [name]  reports that he has never smoked. He has never used smokeless tobacco. He reports current alcohol use. He reports current drug use. Drug: Marijuana. Medical:  has a past medical history of Diabetes mellitus without complication (HCC) and Hypertension. Surgical: [name]  has a past surgical history that includes Appendectomy; Coronary angioplasty with stent; and LEFT HEART CATH AND CORONARY ANGIOGRAPHY (N/A, 05/23/2017). Family: family history is not on file.  Laboratory Chemistry Profile   Renal Lab Results  Component Value Date   BUN 22 03/11/2023   CREATININE 1.58 (H) 03/11/2023   BCR 14 03/11/2023   GFRAA >60 06/16/2017   GFRNONAA >60 06/16/2017    Hepatic Lab Results  Component Value Date   AST 14 03/11/2023   ALT 29 06/16/2017   ALBUMIN 4.5 03/11/2023   ALKPHOS 144 (H) 03/11/2023   LIPASE 18 06/16/2017    Electrolytes Lab Results  Component Value Date   NA 133 (L) 03/11/2023   K CANCELED 03/11/2023   CL 101 03/11/2023   CALCIUM 9.3 03/11/2023   MG 1.7 03/11/2023    Bone Lab Results  Component Value Date   25OHVITD1 27 (L) 03/11/2023   25OHVITD2 7.5 03/11/2023   25OHVITD3 19 03/11/2023    Inflammation (CRP: Acute Phase) (ESR: Chronic Phase) Lab Results  Component Value Date   CRP 2 03/11/2023   ESRSEDRATE 16 03/11/2023         Note: Above Lab results reviewed.  Recent Imaging Review  MR CERVICAL SPINE WO CONTRAST CLINICAL DATA:  Initial evaluation for chronic neck pain.  EXAM: MRI CERVICAL SPINE  WITHOUT CONTRAST  TECHNIQUE: Multiplanar, multisequence MR imaging of the cervical spine was performed. No intravenous contrast was administered.  COMPARISON:  Prior radiograph from 07/23/2023  FINDINGS: Alignment: Straightening with mild reversal of the normal cervical lordosis. Trace facet mediated anterolisthesis of C4 on C5 and C7 on T1.  Vertebrae: Vertebral body height maintained without acute or chronic fracture. Bone marrow signal intensity within normal limits. No worrisome osseous lesions. Mild reactive marrow edema present about the right C5-6 and left C7-T1 facets due to facet arthritis.  Cord: Normal signal and morphology.  Posterior Fossa, vertebral arteries, paraspinal tissues: Unremarkable.  Disc levels:  C2-C3: Minimal uncovertebral spurring without significant disc bulge. Moderate left-sided facet arthrosis. No spinal stenosis. Foramina remain patent.  C3-C4: Central disc protrusion indents the ventral thecal sac, contacting and mildly flattening the ventral cord (series 9, image 11). Superimposed moderate left with mild right facet hypertrophy. Resultant mild-to-moderate spinal stenosis. Superimposed uncovertebral spurring with severe bilateral C4 foraminal narrowing.  C4-C5: Mild disc bulge with bilateral uncovertebral spurring. Moderate right facet arthrosis. No significant spinal stenosis. Mild left with moderate right C5 foraminal narrowing.  C5-C6: Degenerative disc space narrowing with diffuse disc osteophyte complex, asymmetric to the right. Flattening of the ventral thecal sac without significant spinal stenosis. Severe right with mild left C6 foraminal narrowing.  C6-C7: Moderate degenerative intervertebral disc space narrowing with circumferential disc osteophyte complex. Broad posterior component flattens the ventral thecal sac, asymmetric to the right. Minimal cord flattening without cord signal changes. No significant spinal stenosis.  Severe right worse than left C7 foraminal narrowing.  C7-T1: Mild disc bulge. Moderate left-sided facet arthrosis. No spinal stenosis. Mild left C8 foraminal narrowing. Right neural foramen remains patent.  IMPRESSION: 1. Multilevel cervical spondylosis with resultant mild to moderate spinal stenosis at C3-4. 2. Multifactorial degenerative changes with resultant multilevel foraminal narrowing as above. Notable findings include severe bilateral C4 foraminal stenosis, moderate right C5 foraminal narrowing, severe right C6 foraminal stenosis, with severe right worse than left C7 foraminal narrowing. 3. Moderate multilevel facet arthrosis as above, most pronounced at C2-3 and C3-4 on the left, C4-5 on the right, and C7-T1 on the left. Findings could contribute to neck pain.  Electronically Signed   By: Rise Mu M.D.   On: 08/26/2023 04:35 Note: Reviewed        Physical Exam  General appearance: Well nourished, well developed, and well hydrated. In no apparent acute distress Mental status: Alert, oriented x 3 (person, place, & time)       Respiratory: No evidence of acute respiratory distress Eyes: PERLA Vitals: There were no vitals taken for this visit. BMI: Estimated body mass index is 29.16 kg/m as calculated from the following:   Height as of 08/06/23: 6' (1.829 m).   Weight as of 08/06/23: 215 lb (97.5 kg). Ideal: Patient weight not recorded  Assessment   Diagnosis Status  No diagnosis found. Controlled Controlled Controlled   Updated Problems: No problems updated.  Plan of Care  Problem-specific:  Assessment and Plan            [name] has a current medication list which includes the following long-term medication(s): amitriptyline, atorvastatin, colchicine, diphenhydramine, gabapentin, glipizide, insulin aspart protamine - aspart, insulin lispro, lisinopril, metoprolol tartrate, mirtazapine, nitroglycerin, omeprazole, trazodone, and  ventolin hfa.  Pharmacotherapy (Medications Ordered): No orders of the defined types were placed in this encounter.  Orders:  No orders of the defined types were placed in this encounter.  Follow-up plan:   No follow-ups on file.      Interventional Therapies  Risk Factors  Considerations:  Polysubstance abuse  homicidal ideations  Hx. ETOH abuse  Hx.  Marijuana abuse  T2IDDM  CAD  HTN  GERD  Gastroparesis  Angina Pectoris    Planned  Pending:      Under consideration:   Therapeutic bilateral lower extremity Qutenza treatment #2  Diagnostic/therapeutic bilateral scaphoid-radial joint (small joint) injection #1  Diagnostic/therapeutic bilateral L3-4 (L3NR) TFESI #1  Diagnostic/therapeutic bilateral L4-5 (L4NR) TFESI #1  Diagnostic/therapeutic bilateral L5-S1 (L5NR) TFESI #  1  Therapeutic bilateral L2-3, L3-4, and L4-5 MILD procedure #1  Diagnostic/therapeutic bilateral L3-4, L4-5 lumbar facet MBB #1  Diagnostic/therapeutic left knee genicular nerve block #1    Completed:   Therapeutic bilateral lower extremity Qutenza treatment x1 (07/23/2023) Referral to hand surgeon for bilateral carpal tunnel release (04/17/2023)    Therapeutic  Palliative (PRN) options:   None established   Completed by other providers:   (05/31/2023) right carpal tunnel and right ring trigger finger release by Evalee Jefferson, MD ([facility]) (03/11/2023) UE EMG/PNCV: Conclusions: This is an abnormal study. These electrodiagnostic findings show evidence of bilateral,  (L>R), severe median neuropathies at the wrists. There is also evidence of a left ulnar neuropathy at the elbow. Electrodiagnostic findings in the arms are suggestive of a generalized polyneuropathy, as can be seen with diabetes. By Elisabeth Most, MD [facility])        Recent Visits No visits were found meeting these conditions. Showing recent visits within past 90 days and meeting all other  requirements Future Appointments Date Type Provider Dept  12/25/23 Appointment Delano Metz, MD [facility]  Showing future appointments within next 90 days and meeting all other requirements  I discussed the assessment and treatment plan with the patient. The patient was provided an opportunity to ask questions and all were answered. The patient agreed with the plan and demonstrated an understanding of the instructions.  Patient advised to call back or seek an in-person evaluation if the symptoms or condition worsens.  Duration of encounter: *** minutes.  Total time on encounter, as per AMA guidelines included both the face-to-face and non-face-to-face time personally spent by the physician and/or other qualified health care professional(s) on the day of the encounter (includes time in activities that require the physician or other qualified health care professional and does not include time in activities normally performed by clinical staff). Physician's time may include the following activities when performed: Preparing to see the patient (e.g., pre-charting review of records, searching for previously ordered imaging, lab work, and nerve conduction tests) Review of prior analgesic pharmacotherapies. Reviewing PMP Interpreting ordered tests (e.g., lab work, imaging, nerve conduction tests) Performing post-procedure evaluations, including interpretation of diagnostic procedures Obtaining and/or reviewing separately obtained history Performing a medically appropriate examination and/or evaluation Counseling and educating the patient/family/caregiver Ordering medications, tests, or procedures Referring and communicating with other health care professionals (when not separately reported) Documenting clinical information in the electronic or other health record Independently interpreting results (not separately reported) and communicating results to the patient/  family/caregiver Care coordination (not separately reported)  Note by: Oswaldo Done, MD Date: 12/25/2023; Time: 5:42 AM

## 2023-12-25 ENCOUNTER — Ambulatory Visit: Payer: MEDICAID | Attending: Pain Medicine | Admitting: Pain Medicine

## 2023-12-25 VITALS — BP 117/65 | HR 69 | Temp 97.3°F | Resp 16 | Ht 72.0 in | Wt 199.8 lb

## 2023-12-25 DIAGNOSIS — M79641 Pain in right hand: Secondary | ICD-10-CM

## 2023-12-25 DIAGNOSIS — M545 Low back pain, unspecified: Secondary | ICD-10-CM | POA: Diagnosis present

## 2023-12-25 DIAGNOSIS — M25532 Pain in left wrist: Secondary | ICD-10-CM | POA: Diagnosis present

## 2023-12-25 DIAGNOSIS — M79604 Pain in right leg: Secondary | ICD-10-CM

## 2023-12-25 DIAGNOSIS — R94130 Abnormal response to nerve stimulation, unspecified: Secondary | ICD-10-CM

## 2023-12-25 DIAGNOSIS — G4486 Cervicogenic headache: Secondary | ICD-10-CM

## 2023-12-25 DIAGNOSIS — M79642 Pain in left hand: Secondary | ICD-10-CM | POA: Diagnosis present

## 2023-12-25 DIAGNOSIS — E1142 Type 2 diabetes mellitus with diabetic polyneuropathy: Secondary | ICD-10-CM

## 2023-12-25 DIAGNOSIS — G8929 Other chronic pain: Secondary | ICD-10-CM | POA: Diagnosis present

## 2023-12-25 DIAGNOSIS — R937 Abnormal findings on diagnostic imaging of other parts of musculoskeletal system: Secondary | ICD-10-CM

## 2023-12-25 DIAGNOSIS — M79605 Pain in left leg: Secondary | ICD-10-CM

## 2023-12-25 DIAGNOSIS — M542 Cervicalgia: Secondary | ICD-10-CM

## 2023-12-25 DIAGNOSIS — E1140 Type 2 diabetes mellitus with diabetic neuropathy, unspecified: Secondary | ICD-10-CM

## 2023-12-25 DIAGNOSIS — M25562 Pain in left knee: Secondary | ICD-10-CM

## 2023-12-25 DIAGNOSIS — M5412 Radiculopathy, cervical region: Secondary | ICD-10-CM | POA: Diagnosis present

## 2023-12-25 DIAGNOSIS — M25531 Pain in right wrist: Secondary | ICD-10-CM | POA: Insufficient documentation

## 2023-12-25 DIAGNOSIS — G63 Polyneuropathy in diseases classified elsewhere: Secondary | ICD-10-CM | POA: Diagnosis present

## 2023-12-25 DIAGNOSIS — G5603 Carpal tunnel syndrome, bilateral upper limbs: Secondary | ICD-10-CM | POA: Diagnosis present

## 2023-12-25 NOTE — Patient Instructions (Addendum)
 Pain Management Discharge Instructions  General Discharge Instructions :  If you need to reach your doctor call: Monday-Friday 8:00 am - 4:00 pm at [phone] or toll free [phone].  After clinic hours [phone] to have operator reach doctor.  Bring all of your medication bottles to all your appointments in the pain clinic.  To cancel or reschedule your appointment with Pain Management please remember to call 24 hours in advance to avoid a fee.  Refer to the educational materials which you have been given on: General Risks, I had my Procedure. Discharge Instructions, Post Sedation.  Post Procedure Instructions:  The drugs you were given will stay in your system until tomorrow, so for the next 24 hours you should not drive, make any legal decisions or drink any alcoholic beverages.  You may eat anything you prefer, but it is better to start with liquids then soups and crackers, and gradually work up to solid foods.  Please notify your doctor immediately if you have any unusual bleeding, trouble breathing or pain that is not related to your normal pain.  Depending on the type of procedure that was done, some parts of your body may feel week and/or numb.  This usually clears up by tonight or the next day.  Walk with the use of an assistive device or accompanied by an adult for the 24 hours.  You may use ice on the affected area for the first 24 hours.  Put ice in a Ziploc bag and cover with a towel and place against area 15 minutes on 15 minutes off.  You may switch to heat after 24 hours. ______________________________________________________________________    Procedure instructions  Stop blood-thinners  Do not eat or drink fluids (other than water) for 6 hours before your procedure  No water for 2 hours before your procedure  Take your blood pressure medicine with a sip of water  Arrive 30 minutes before your appointment  If sedation is planned, bring suitable  driver. Pennie Banter, Benedetto Goad, & public transportation are NOT APPROVED)  Carefully read the "Preparing for your procedure" detailed instructions  If you have questions call us at [phone]  Procedure appointments are for procedures only. NO medication refills or new problem evaluations.   ______________________________________________________________________      ______________________________________________________________________    Preparing for your procedure  Appointments: If you think you may not be able to keep your appointment, call 24-48 hours in advance to cancel. We need time to make it available to others.  Procedure visits are for procedures only. During your procedure appointment there will be: NO Prescription Refills*. NO medication changes or discussions*. NO discussion of disability issues*. NO unrelated pain problem evaluations*. NO evaluations to order other pain procedures*. *These will be addressed at a separate and distinct evaluation encounter on the provider's evaluation schedule and not during procedure days.  Instructions: Food intake: Avoid eating anything solid for at least 8 hours prior to your procedure. Clear liquid intake: You may take clear liquids such as water up to 2 hours prior to your procedure. (No carbonated drinks. No soda.) Transportation: Unless otherwise stated by your physician, bring a driver. (Driver cannot be a Market researcher, Pharmacist, community, or any other form of public transportation.) Morning Medicines: Except for blood thinners, take all of your other morning medications with a sip of water. Make sure to take your heart and blood pressure medicines. If your blood pressure's lower number is above 100, the case will be rescheduled. Blood thinners: Make sure to stop  your blood thinners as instructed.  If you take a blood thinner, but were not instructed to stop it, call our office [phone] and ask to talk to a nurse. Not stopping a blood thinner prior to  certain procedures could lead to serious complications. Diabetics on insulin: Notify the staff so that you can be scheduled 1st case in the morning. If your diabetes requires high dose insulin, take only  of your normal insulin dose the morning of the procedure and notify the staff that you have done so. Preventing infections: Shower with an antibacterial soap the morning of your procedure.  Build-up your immune system: Take 1000 mg of Vitamin C with every meal (3 times a day) the day prior to your procedure. Antibiotics: Inform the nursing staff if you are taking any antibiotics or if you have any conditions that may require antibiotics prior to procedures. (Example: recent joint implants)   Pregnancy: If you are pregnant make sure to notify the nursing staff. Not doing so may result in injury to the fetus, including death.  Sickness: If you have a cold, fever, or any active infections, call and cancel or reschedule your procedure. Receiving steroids while having an infection may result in complications. Arrival: You must be in the facility at least 30 minutes prior to your scheduled procedure. Tardiness: Your scheduled time is also the cutoff time. If you do not arrive at least 15 minutes prior to your procedure, you will be rescheduled.  Children: Do not bring any children with you. Make arrangements to keep them home. Dress appropriately: There is always a possibility that your clothing may get soiled. Avoid long dresses. Valuables: Do not bring any jewelry or valuables.  Reasons to call and reschedule or cancel your procedure: (Following these recommendations will minimize the risk of a serious complication.) Surgeries: Avoid having procedures within 2 weeks of any surgery. (Avoid for 2 weeks before or after any surgery). Flu Shots: Avoid having procedures within 2 weeks of a flu shots or . (Avoid for 2 weeks before or after immunizations). Barium: Avoid having a procedure within 7-10 days  after having had a radiological study involving the use of radiological contrast. (Myelograms, Barium swallow or enema study). Heart attacks: Avoid any elective procedures or surgeries for the initial 6 months after a "Myocardial Infarction" (Heart Attack). Blood thinners: It is imperative that you stop these medications before procedures. Let us know if you if you take any blood thinner.  Infection: Avoid procedures during or within two weeks of an infection (including chest colds or gastrointestinal problems). Symptoms associated with infections include: Localized redness, fever, chills, night sweats or profuse sweating, burning sensation when voiding, cough, congestion, stuffiness, runny nose, sore throat, diarrhea, nausea, vomiting, cold or Flu symptoms, recent or current infections. It is specially important if the infection is over the area that we intend to treat. Heart and lung problems: Symptoms that may suggest an active cardiopulmonary problem include: cough, chest pain, breathing difficulties or shortness of breath, dizziness, ankle swelling, uncontrolled high or unusually low blood pressure, and/or palpitations. If you are experiencing any of these symptoms, cancel your procedure and contact your primary care physician for an evaluation.  Remember:  Regular Business hours are:  Monday to Thursday 8:00 AM to 4:00 PM  Provider's Schedule: Delano Metz, MD:  Procedure days: Tuesday and Thursday 7:30 AM to 4:00 PM  Edward Jolly, MD:  Procedure days: Monday and Wednesday 7:30 AM to 4:00 PM Last  Updated: 09/03/2023 ______________________________________________________________________  ______________________________________________________________________    General Risks and Possible Complications  Patient Responsibilities: It is important that you read this as it is part of your informed consent. It is our duty to inform you of the risks and possible complications  associated with treatments offered to you. It is your responsibility as a patient to read this and to ask questions about anything that is not clear or that you believe was not covered in this document.  Patient's Rights: You have the right to refuse treatment. You also have the right to change your mind, even after initially having agreed to have the treatment done. However, under this last option, if you wait until the last second to change your mind, you may be charged for the materials used up to that point.  Introduction: Medicine is not an Visual merchandiser. Everything in Medicine, including the lack of treatment(s), carries the potential for danger, harm, or loss (which is by definition: Risk). In Medicine, a complication is a secondary problem, condition, or disease that can aggravate an already existing one. All treatments carry the risk of possible complications. The fact that a side effects or complications occurs, does not imply that the treatment was conducted incorrectly. It must be clearly understood that these can happen even when everything is done following the highest safety standards.  No treatment: You can choose not to proceed with the proposed treatment alternative. The "PRO(s)" would include: avoiding the risk of complications associated with the therapy. The "CON(s)" would include: not getting any of the treatment benefits. These benefits fall under one of three categories: diagnostic; therapeutic; and/or palliative. Diagnostic benefits include: getting information which can ultimately lead to improvement of the disease or symptom(s). Therapeutic benefits are those associated with the successful treatment of the disease. Finally, palliative benefits are those related to the decrease of the primary symptoms, without necessarily curing the condition (example: decreasing the pain from a flare-up of a chronic condition, such as incurable terminal cancer).  General Risks and Complications:  These are associated to most interventional treatments. They can occur alone, or in combination. They fall under one of the following six (6) categories: no benefit or worsening of symptoms; bleeding; infection; nerve damage; allergic reactions; and/or death. No benefits or worsening of symptoms: In Medicine there are no guarantees, only probabilities. No healthcare provider can ever guarantee that a medical treatment will work, they can only state the probability that it may. Furthermore, there is always the possibility that the condition may worsen, either directly, or indirectly, as a consequence of the treatment. Bleeding: This is more common if the patient is taking a blood thinner, either prescription or over the counter (example: Goody Powders, Fish oil, Aspirin, Garlic, etc.), or if suffering a condition associated with impaired coagulation (example: Hemophilia, cirrhosis of the liver, low platelet counts, etc.). However, even if you do not have one on these, it can still happen. If you have any of these conditions, or take one of these drugs, make sure to notify your treating physician. Infection: This is more common in patients with a compromised immune system, either due to disease (example: diabetes, cancer, human immunodeficiency virus [HIV], etc.), or due to medications or treatments (example: therapies used to treat cancer and rheumatological diseases). However, even if you do not have one on these, it can still happen. If you have any of these conditions, or take one of these drugs, make sure to notify your treating physician. Nerve Damage: This is more common when the treatment  is an invasive one, but it can also happen with the use of medications, such as those used in the treatment of cancer. The damage can occur to small secondary nerves, or to large primary ones, such as those in the spinal cord and brain. This damage may be temporary or permanent and it may lead to impairments that can  range from temporary numbness to permanent paralysis and/or brain death. Allergic Reactions: Any time a substance or material comes in contact with our body, there is the possibility of an allergic reaction. These can range from a mild skin rash (contact dermatitis) to a severe systemic reaction (anaphylactic reaction), which can result in death. Death: In general, any medical intervention can result in death, most of the time due to an unforeseen complication. ______________________________________________________________________

## 2023-12-25 NOTE — Progress Notes (Signed)
 Safety precautions to be maintained throughout the outpatient stay will include: orient to surroundings, keep bed in low position, maintain call bell within reach at all times, provide assistance with transfer out of bed and ambulation.

## 2024-01-26 NOTE — Progress Notes (Unsigned)
 PROVIDER NOTE: Interpretation of information contained herein should be left to medically-trained personnel. Specific patient instructions are provided elsewhere under "Patient Instructions" section of medical record. This document was created in part using AI and STT-dictation technology, any transcriptional errors that may result from this process are unintentional.  Patient: Todd Wagner  Service: E/M   PCP: [facility]  DOB: 1961-06-15  DOS: 01/27/2024  Provider: Candi Chafe, MD  MRN: [id number]  Delivery: Face-to-face  Specialty: Interventional Pain Management  Type: Established Patient  Setting: Ambulatory outpatient facility  Specialty designation: 09  Referring Prov.: [facility]  Location: Outpatient office facility       HPI  Mr. Mort Slovacek, a 63 y.o. year old male, is here today because of his No primary diagnosis found.. Mr. Dalesio primary complain today is No chief complaint on file.  Pertinent problems: Mr. 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, L4-5, L5-S1) (Severe: L4-5); Lumbar central spinal stenosis w/o claudication; Old tear of medial meniscus of knee (Left); Osteoarthritis of hands (Bilateral); Chronic hand pain (1ry area of Pain) (Bilateral); Osteoarthritis of wrists (Bilateral); Low back pain of over 3 months duration; Chronic knee pain (3ry area of Pain) (Left); Chronic low back pain (2ry area  of Pain) (Midline) w/o sciatica; Lumbar facet joint pain; Chronic feet pain (5th area of Pain) (Bilateral) (R=L); Chronic lower extremity pain (4th area of Pain) (Bilateral) (R=L); Abnormal MRI, lumbar spine (01/26/2023); Carpal tunnel syndrome (Bilateral) (L>R); Ulnar neuropathy of upper extremity (Left); Generalized polyneuropathy; Hypertrophy of ligamentum flavum (L2-3, L3-4, L4-5); Abnormal NCS (UE) (nerve conduction studies) (03/11/2023); Traumatic osteoarthritis of wrist (Left); Arthropathy of wrist (Left); Chronic painful diabetic neuropathy (HCC); Neuropathy, median nerve (Bilateral) (L>R); Cubital tunnel syndrome (Left); Acute pain of right shoulder; Calcific tendinitis of shoulder; Medial epicondylitis; Ring finger, trigger finger (Right); Carpal tunnel syndrome (Right); Polyarthralgia; History of carpal tunnel release (05/31/2023) (Right); S/P ring finger trigger finger release (05/31/2023) (Right); Non-articular hand pain (Right); S/P carpal tunnel release (05/31/2023) (Right); Diabetic sensorimotor polyneuropathy (HCC); Chronic neck pain (Bilateral); Cervicogenic headache (Bilateral); Anterolisthesis of cervical spine; Foraminal stenosis of cervical region; Cervical radiculopathy, chronic; and Abnormal MRI, cervical spine (08/26/2023) on their pertinent problem list. Pain Assessment: Severity of   is reported as a  /10. Location:    / . Onset:  . Quality:  . Timing:  . Modifying factor(s):  Aaron Aas Vitals:  vitals were not taken for this visit.  BMI: Estimated body mass index is 27.1 kg/m as calculated from the following:   Height as of 12/25/23: 6' (1.829 m).   Weight as of 12/25/23: 199 lb 12.8 oz (90.6 kg). Last encounter: 12/25/2023. Last procedure: 07/23/2023.  Reason for encounter: follow-up evaluation. ***  Discussed the use of AI scribe software for clinical note transcription with the patient, who gave verbal consent to proceed.  History of Present Illness           Pharmacotherapy Assessment   Analgesic: No chronic opioid analgesics therapy prescribed by our practice. None MME/day: 0 mg/day   Monitoring: [place] PMP: PDMP reviewed during this encounter.  Pharmacotherapy: No side-effects or adverse reactions reported. Compliance: No problems identified. Effectiveness: Clinically acceptable.  No notes on file  No results found for: "CBDTHCR" No results found for: "D8THCCBX" No results found for: "D9THCCBX"  UDS:  Summary  Date Value Ref Range Status  03/11/2023 Note  Final    Comment:    ==================================================================== Compliance Drug Analysis, Ur ==================================================================== Test                             Result       Flag       Units  Drug Present and Declared for Prescription Verification   Gabapentin                     PRESENT      EXPECTED   Amitriptyline                   PRESENT      EXPECTED   Mirtazapine                    PRESENT      EXPECTED   Trazodone                      PRESENT      EXPECTED   1,3 chlorophenyl piperazine    PRESENT      EXPECTED    1,3-chlorophenyl piperazine is an expected metabolite of trazodone.    Metoprolol                      PRESENT      EXPECTED  Drug Absent but Declared for Prescription Verification   Hydrocodone                    Not Detected UNEXPECTED ng/mg creat   Acetaminophen                   Not Detected UNEXPECTED    Acetaminophen , as indicated in the declared medication list, is not    always detected even when used as directed.    Salicylate                     Not Detected UNEXPECTED    Aspirin , as indicated in the declared medication list, is not always    detected even when used as directed.    Diphenhydramine                Not Detected UNEXPECTED ==================================================================== Test                      Result    Flag   Units      Ref Range   Creatinine              67               mg/dL       >=04 ==================================================================== Declared Medications:  The flagging and interpretation on this report are based on the  following declared medications.  Unexpected results may arise from  inaccuracies in the declared medications.   **Note: The testing scope of this panel includes these medications:   Amitriptyline  (Elavil )  Diphenhydramine (Sominex)  Gabapentin (Neurontin)  Hydrocodone (Norco)  Metoprolol  (Lopressor )  Mirtazapine (Remeron)  Trazodone (Desyrel)   **Note: The testing scope of this panel does not include small to  moderate  amounts of these reported medications:   Acetaminophen  (Tylenol )  Acetaminophen  (Norco)  Aspirin    **Note: The testing scope of this panel does not include the  following reported medications:   Albuterol (Ventolin HFA)  Atorvastatin  (Lipitor)  Colchicine  Glipizide (Glucotrol)  Insulin  (NovoLog )  Lisinopril  (Zestril )  Melatonin  Meloxicam (Mobic)  Metformin (Glucophage)  Nitroglycerin  (Nitrostat )  Omeprazole (Prilosec)  Ondansetron  (Zofran ) ==================================================================== For clinical consultation, please call [phone]. ====================================================================       ROS  Constitutional: Denies any fever or chills Gastrointestinal: No reported hemesis, hematochezia, vomiting, or acute GI distress Musculoskeletal: Denies any acute onset joint swelling, redness, loss of ROM, or weakness Neurological: No reported episodes of acute onset apraxia, aphasia, dysarthria, agnosia, amnesia, paralysis, loss of coordination, or loss of consciousness  Medication Review  Accu-Chek Guide Me, Dexcom G7 Sensor, HYDROcodone-acetaminophen , Melatonin, Semaglutide(0.25 or 0.5MG /DOS), acetaminophen , albuterol, amitriptyline , aspirin , atorvastatin , colchicine, diclofenac Sodium, diphenhydrAMINE, gabapentin, glipiZIDE, insulin  aspart  protamine - aspart, insulin  glargine, insulin  lispro, isosorbide mononitrate, lisinopril , metFORMIN, metoprolol  tartrate, mirtazapine, nitroGLYCERIN , omeprazole, ondansetron , and traZODone  History Review  Allergy: Mr. Renick has no known allergies. Drug: Mr. Gaida  reports current drug use. Drug: Marijuana. Alcohol:  reports current alcohol use. Tobacco:  reports that he has never smoked. He has never used smokeless tobacco. Social: Mr. Kuznetsov  reports that he has never smoked. He has never used smokeless tobacco. He reports current alcohol use. He reports current drug use. Drug: Marijuana. Medical:  has a past medical history of Diabetes mellitus without complication (HCC) and Hypertension. Surgical: Mr. [place]  has a past surgical history that includes Appendectomy; Coronary angioplasty with stent; and LEFT HEART CATH AND CORONARY ANGIOGRAPHY (N/A, 05/23/2017). Family: family history is not on file.  Laboratory Chemistry Profile   Renal Lab Results  Component Value Date   BUN 22 03/11/2023   CREATININE 1.58 (H) 03/11/2023   BCR 14 03/11/2023   GFRAA >60 06/16/2017   GFRNONAA >60 06/16/2017    Hepatic Lab Results  Component Value Date   AST 14 03/11/2023   ALT 29 06/16/2017   ALBUMIN 4.5 03/11/2023   ALKPHOS 144 (H) 03/11/2023   LIPASE 18 06/16/2017    Electrolytes Lab Results  Component Value Date   NA 133 (L) 03/11/2023   K CANCELED 03/11/2023   CL 101 03/11/2023   CALCIUM  9.3 03/11/2023   MG 1.7 03/11/2023    Bone Lab Results  Component Value Date   25OHVITD1 27 (L) 03/11/2023   25OHVITD2 7.5 03/11/2023   25OHVITD3 19 03/11/2023    Inflammation (CRP: Acute Phase) (ESR: Chronic Phase) Lab Results  Component Value Date   CRP 2 03/11/2023   ESRSEDRATE 16 03/11/2023         Note: Above Lab results reviewed.  Recent Imaging Review  MR CERVICAL SPINE WO CONTRAST CLINICAL DATA:  Initial evaluation for chronic neck pain.  EXAM: MRI CERVICAL SPINE WITHOUT  CONTRAST  TECHNIQUE: Multiplanar, multisequence MR imaging of the cervical spine was performed. No intravenous contrast was administered.  COMPARISON:  Prior radiograph from 07/23/2023  FINDINGS: Alignment: Straightening with mild reversal of the normal cervical lordosis. Trace facet mediated anterolisthesis of C4 on C5 and C7 on T1.  Vertebrae: Vertebral body height maintained without acute or chronic fracture. Bone marrow signal intensity within normal limits. No worrisome osseous lesions. Mild reactive marrow edema present about the right C5-6 and left C7-T1 facets due to facet arthritis.  Cord: Normal signal and morphology.  Posterior Fossa, vertebral arteries, paraspinal tissues:  Unremarkable.  Disc levels:  C2-C3: Minimal uncovertebral spurring without significant disc bulge. Moderate left-sided facet arthrosis. No spinal stenosis. Foramina remain patent.  C3-C4: Central disc protrusion indents the ventral thecal sac, contacting and mildly flattening the ventral cord (series 9, image 11). Superimposed moderate left with mild right facet hypertrophy. Resultant mild-to-moderate spinal stenosis. Superimposed uncovertebral spurring with severe bilateral C4 foraminal narrowing.  C4-C5: Mild disc bulge with bilateral uncovertebral spurring. Moderate right facet arthrosis. No significant spinal stenosis. Mild left with moderate right C5 foraminal narrowing.  C5-C6: Degenerative disc space narrowing with diffuse disc osteophyte complex, asymmetric to the right. Flattening of the ventral thecal sac without significant spinal stenosis. Severe right with mild left C6 foraminal narrowing.  C6-C7: Moderate degenerative intervertebral disc space narrowing with circumferential disc osteophyte complex. Broad posterior component flattens the ventral thecal sac, asymmetric to the right. Minimal cord flattening without cord signal changes. No significant spinal stenosis. Severe  right worse than left C7 foraminal narrowing.  C7-T1: Mild disc bulge. Moderate left-sided facet arthrosis. No spinal stenosis. Mild left C8 foraminal narrowing. Right neural foramen remains patent.  IMPRESSION: 1. Multilevel cervical spondylosis with resultant mild to moderate spinal stenosis at C3-4. 2. Multifactorial degenerative changes with resultant multilevel foraminal narrowing as above. Notable findings include severe bilateral C4 foraminal stenosis, moderate right C5 foraminal narrowing, severe right C6 foraminal stenosis, with severe right worse than left C7 foraminal narrowing. 3. Moderate multilevel facet arthrosis as above, most pronounced at C2-3 and C3-4 on the left, C4-5 on the right, and C7-T1 on the left. Findings could contribute to neck pain.  Electronically Signed   By: Virgia Griffins M.D.   On: 08/26/2023 04:35 Note: Reviewed        Physical Exam  General appearance: Well nourished, well developed, and well hydrated. In no apparent acute distress Mental status: Alert, oriented x 3 (person, place, & time)       Respiratory: No evidence of acute respiratory distress Eyes: PERLA Vitals: There were no vitals taken for this visit. BMI: Estimated body mass index is 27.1 kg/m as calculated from the following:   Height as of 12/25/23: 6' (1.829 m).   Weight as of 12/25/23: 199 lb 12.8 oz (90.6 kg). Ideal: Patient weight not recorded  Assessment   Diagnosis Status  No diagnosis found. Controlled Controlled Controlled   Updated Problems: No problems updated.  Plan of Care  Problem-specific:  Assessment and Plan            Mr. Hosey Oesterling has a current medication list which includes the following long-term medication(s): amitriptyline , atorvastatin , colchicine, diphenhydramine, gabapentin, glipizide, insulin  aspart protamine - aspart, insulin  lispro, isosorbide mononitrate, isosorbide mononitrate, lisinopril , metoprolol  tartrate, mirtazapine,  nitroglycerin , omeprazole, trazodone, and ventolin hfa.  Pharmacotherapy (Medications Ordered): No orders of the defined types were placed in this encounter.  Orders:  No orders of the defined types were placed in this encounter.  Follow-up plan:   No follow-ups on file.     Interventional Therapies  Risk Factors  Considerations:  Polysubstance abuse  homicidal ideations  Hx. ETOH abuse  Hx.  Marijuana abuse  T2IDDM  CAD  HTN  GERD  Gastroparesis  Angina Pectoris    Planned  Pending:   Therapeutic right CESI #1  Therapeutic bilateral lower extremity Qutenza  treatment #2    Under consideration:   Therapeutic bilateral lower extremity Qutenza  treatment #2  Diagnostic/therapeutic bilateral scaphoid-radial joint (small joint) injection #1  Diagnostic/therapeutic bilateral L3-4 (L3NR) TFESI #1  Diagnostic/therapeutic  bilateral L4-5 (L4NR) TFESI #1  Diagnostic/therapeutic bilateral L5-S1 (L5NR) TFESI #1  Therapeutic bilateral L2-3, L3-4, and L4-5 MILD procedure #1  Diagnostic/therapeutic bilateral L3-4, L4-5 lumbar facet MBB #1  Diagnostic/therapeutic left knee genicular nerve block #1    Completed:   Therapeutic bilateral lower extremity Qutenza  treatment x1 (07/23/2023) Referral to hand surgeon for bilateral carpal tunnel release (04/17/2023)    Therapeutic  Palliative (PRN) options:   None established   Completed by other providers:   (05/31/2023) right carpal tunnel and right ring trigger finger release by Dail Drought, MD ([facility]) (03/11/2023) UE EMG/PNCV: Conclusions: This is an abnormal study. These electrodiagnostic findings show evidence of bilateral,  (L>R), severe median neuropathies at the wrists. There is also evidence of a left ulnar neuropathy at the elbow. Electrodiagnostic findings in the arms are suggestive of a generalized polyneuropathy, as can be seen with diabetes. By Romayne Clubs, MD [facility])      Recent Visits Date  Type Provider Dept  12/25/23 Office Visit Renaldo Caroli, MD [facility]  Showing recent visits within past 90 days and meeting all other requirements Future Appointments Date Type Provider Dept  01/27/24 Appointment Renaldo Caroli, MD [facility]  Showing future appointments within next 90 days and meeting all other requirements  I discussed the assessment and treatment plan with the patient. The patient was provided an opportunity to ask questions and all were answered. The patient agreed with the plan and demonstrated an understanding of the instructions.  Patient advised to call back or seek an in-person evaluation if the symptoms or condition worsens.  Duration of encounter: *** minutes.  Total time on encounter, as per AMA guidelines included both the face-to-face and non-face-to-face time personally spent by the physician and/or other qualified health care professional(s) on the day of the encounter (includes time in activities that require the physician or other qualified health care professional and does not include time in activities normally performed by clinical staff). Physician's time may include the following activities when performed: Preparing to see the patient (e.g., pre-charting review of records, searching for previously ordered imaging, lab work, and nerve conduction tests) Review of prior analgesic pharmacotherapies. Reviewing PMP Interpreting ordered tests (e.g., lab work, imaging, nerve conduction tests) Performing post-procedure evaluations, including interpretation of diagnostic procedures Obtaining and/or reviewing separately obtained history Performing a medically appropriate examination and/or evaluation Counseling and educating the patient/family/caregiver Ordering medications, tests, or procedures Referring and communicating with other health care professionals (when not separately reported) Documenting clinical information in the  electronic or other health record Independently interpreting results (not separately reported) and communicating results to the patient/ family/caregiver Care coordination (not separately reported)  Note by: Candi Chafe, MD (TTS and AI technology used. I apologize for any typographical errors that were not detected and corrected.) Date: 01/27/2024; Time: 9:45 AM

## 2024-01-27 ENCOUNTER — Encounter: Payer: Self-pay | Admitting: Pain Medicine

## 2024-01-27 ENCOUNTER — Ambulatory Visit
Admission: RE | Admit: 2024-01-27 | Discharge: 2024-01-27 | Disposition: A | Discharge status: Home / Self Care | Payer: MEDICAID | Source: Ambulatory Visit | Attending: Pain Medicine | Admitting: Pain Medicine

## 2024-01-27 ENCOUNTER — Ambulatory Visit (HOSPITAL_BASED_OUTPATIENT_CLINIC_OR_DEPARTMENT_OTHER): Payer: MEDICAID | Admitting: Pain Medicine

## 2024-01-27 VITALS — BP 139/76 | HR 68 | Temp 97.3°F | Resp 18 | Ht 72.0 in | Wt 200.0 lb

## 2024-01-27 DIAGNOSIS — S99922A Unspecified injury of left foot, initial encounter: Secondary | ICD-10-CM | POA: Insufficient documentation

## 2024-01-27 DIAGNOSIS — R200 Anesthesia of skin: Secondary | ICD-10-CM | POA: Insufficient documentation

## 2024-01-27 DIAGNOSIS — R202 Paresthesia of skin: Secondary | ICD-10-CM | POA: Diagnosis present

## 2024-01-27 DIAGNOSIS — M79671 Pain in right foot: Secondary | ICD-10-CM | POA: Insufficient documentation

## 2024-01-27 DIAGNOSIS — R94130 Abnormal response to nerve stimulation, unspecified: Secondary | ICD-10-CM | POA: Insufficient documentation

## 2024-01-27 DIAGNOSIS — G8929 Other chronic pain: Secondary | ICD-10-CM

## 2024-01-27 DIAGNOSIS — E1140 Type 2 diabetes mellitus with diabetic neuropathy, unspecified: Secondary | ICD-10-CM | POA: Diagnosis present

## 2024-01-27 DIAGNOSIS — E1142 Type 2 diabetes mellitus with diabetic polyneuropathy: Secondary | ICD-10-CM | POA: Insufficient documentation

## 2024-01-27 DIAGNOSIS — M5030 Other cervical disc degeneration, unspecified cervical region: Secondary | ICD-10-CM | POA: Insufficient documentation

## 2024-01-27 DIAGNOSIS — R2242 Localized swelling, mass and lump, left lower limb: Secondary | ICD-10-CM

## 2024-01-27 DIAGNOSIS — Z7901 Long term (current) use of anticoagulants: Secondary | ICD-10-CM | POA: Insufficient documentation

## 2024-01-27 DIAGNOSIS — M542 Cervicalgia: Secondary | ICD-10-CM | POA: Insufficient documentation

## 2024-01-27 DIAGNOSIS — M79672 Pain in left foot: Secondary | ICD-10-CM | POA: Diagnosis present

## 2024-01-27 DIAGNOSIS — M4312 Spondylolisthesis, cervical region: Secondary | ICD-10-CM | POA: Insufficient documentation

## 2024-01-27 DIAGNOSIS — M47812 Spondylosis without myelopathy or radiculopathy, cervical region: Secondary | ICD-10-CM | POA: Insufficient documentation

## 2024-01-27 DIAGNOSIS — R937 Abnormal findings on diagnostic imaging of other parts of musculoskeletal system: Secondary | ICD-10-CM | POA: Insufficient documentation

## 2024-01-27 NOTE — Progress Notes (Signed)
 Safety precautions to be maintained throughout the outpatient stay will include: orient to surroundings, keep bed in low position, maintain call bell within reach at all times, provide assistance with transfer out of bed and ambulation.

## 2024-01-28 LAB — CBC WITH DIFFERENTIAL/PLATELET
Basophils Absolute: 0 10*3/uL (ref 0.0–0.2)
Basos: 1 %
EOS (ABSOLUTE): 0.4 10*3/uL (ref 0.0–0.4)
Eos: 5 %
Hematocrit: 29.9 % — ABNORMAL LOW (ref 37.5–51.0)
Hemoglobin: 9.8 g/dL — ABNORMAL LOW (ref 13.0–17.7)
Immature Grans (Abs): 0 10*3/uL (ref 0.0–0.1)
Immature Granulocytes: 0 %
Lymphocytes Absolute: 1.8 10*3/uL (ref 0.7–3.1)
Lymphs: 24 %
MCH: 30.8 pg (ref 26.6–33.0)
MCHC: 32.8 g/dL (ref 31.5–35.7)
MCV: 94 fL (ref 79–97)
Monocytes Absolute: 0.6 10*3/uL (ref 0.1–0.9)
Monocytes: 8 %
Neutrophils Absolute: 4.6 10*3/uL (ref 1.4–7.0)
Neutrophils: 62 %
Platelets: 296 10*3/uL (ref 150–450)
RBC: 3.18 x10E6/uL — ABNORMAL LOW (ref 4.14–5.80)
RDW: 10.6 % — ABNORMAL LOW (ref 11.6–15.4)
WBC: 7.4 10*3/uL (ref 3.4–10.8)

## 2024-06-27 DIAGNOSIS — E785 Hyperlipidemia, unspecified: Secondary | ICD-10-CM | POA: Diagnosis not present

## 2024-06-27 DIAGNOSIS — I2510 Atherosclerotic heart disease of native coronary artery without angina pectoris: Secondary | ICD-10-CM | POA: Diagnosis not present

## 2024-06-27 DIAGNOSIS — E1165 Type 2 diabetes mellitus with hyperglycemia: Secondary | ICD-10-CM | POA: Diagnosis not present

## 2024-06-27 DIAGNOSIS — R079 Chest pain, unspecified: Secondary | ICD-10-CM | POA: Diagnosis not present

## 2024-06-27 DIAGNOSIS — I10 Essential (primary) hypertension: Secondary | ICD-10-CM | POA: Diagnosis not present
